# Patient Record
Sex: Male | Born: 1995 | Hispanic: No | Marital: Single
Health system: Southern US, Academic
[De-identification: ages and names within clinical notes are randomized; demographics above are authoritative.]

## PROBLEM LIST (undated history)

## (undated) ENCOUNTER — Ambulatory Visit: Attending: Pharmacist | Primary: Pharmacist

## (undated) ENCOUNTER — Encounter

## (undated) ENCOUNTER — Telehealth

## (undated) ENCOUNTER — Ambulatory Visit

## (undated) ENCOUNTER — Encounter
Attending: Pharmacist Clinician (PhC)/ Clinical Pharmacy Specialist | Primary: Pharmacist Clinician (PhC)/ Clinical Pharmacy Specialist

## (undated) ENCOUNTER — Encounter: Attending: Physician Assistant | Primary: Physician Assistant

## (undated) ENCOUNTER — Telehealth
Attending: Pharmacist Clinician (PhC)/ Clinical Pharmacy Specialist | Primary: Pharmacist Clinician (PhC)/ Clinical Pharmacy Specialist

## (undated) ENCOUNTER — Ambulatory Visit: Payer: PRIVATE HEALTH INSURANCE | Attending: Physician Assistant | Primary: Physician Assistant

## (undated) ENCOUNTER — Encounter: Attending: Neurology | Primary: Neurology

## (undated) ENCOUNTER — Ambulatory Visit: Payer: PRIVATE HEALTH INSURANCE

## (undated) ENCOUNTER — Inpatient Hospital Stay

## (undated) DIAGNOSIS — J302 Other seasonal allergic rhinitis: Secondary | ICD-10-CM

## (undated) DIAGNOSIS — J45909 Unspecified asthma, uncomplicated: Secondary | ICD-10-CM

---
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
## 2020-06-10 ENCOUNTER — Emergency Department
Admission: EM | Admit: 2020-06-10 | Discharge: 2020-06-10 | Disposition: A | Discharge status: Home / Self Care | Payer: Medicaid Other | Attending: Emergency Medicine | Admitting: Emergency Medicine

## 2020-06-10 ENCOUNTER — Encounter: Payer: Self-pay | Admitting: Emergency Medicine

## 2020-06-10 ENCOUNTER — Emergency Department: Payer: Medicaid Other

## 2020-06-10 ENCOUNTER — Other Ambulatory Visit: Payer: Self-pay

## 2020-06-10 DIAGNOSIS — Y929 Unspecified place or not applicable: Secondary | ICD-10-CM | POA: Insufficient documentation

## 2020-06-10 DIAGNOSIS — Y939 Activity, unspecified: Secondary | ICD-10-CM | POA: Diagnosis not present

## 2020-06-10 DIAGNOSIS — R55 Syncope and collapse: Secondary | ICD-10-CM

## 2020-06-10 DIAGNOSIS — S0990XA Unspecified injury of head, initial encounter: Secondary | ICD-10-CM | POA: Insufficient documentation

## 2020-06-10 DIAGNOSIS — W010XXA Fall on same level from slipping, tripping and stumbling without subsequent striking against object, initial encounter: Secondary | ICD-10-CM | POA: Diagnosis not present

## 2020-06-10 DIAGNOSIS — Y999 Unspecified external cause status: Secondary | ICD-10-CM | POA: Insufficient documentation

## 2020-06-10 LAB — CBC
HCT: 46.4 % (ref 39.0–52.0)
Hemoglobin: 15.9 g/dL (ref 13.0–17.0)
MCH: 30.7 pg (ref 26.0–34.0)
MCHC: 34.3 g/dL (ref 30.0–36.0)
MCV: 89.6 fL (ref 80.0–100.0)
Platelets: 134 10*3/uL — ABNORMAL LOW (ref 150–400)
RBC: 5.18 MIL/uL (ref 4.22–5.81)
RDW: 13.2 % (ref 11.5–15.5)
WBC: 8.2 10*3/uL (ref 4.0–10.5)
nRBC: 0 % (ref 0.0–0.2)

## 2020-06-10 LAB — URINALYSIS, COMPLETE (UACMP) WITH MICROSCOPIC
Bacteria, UA: NONE SEEN
Bilirubin Urine: NEGATIVE
Glucose, UA: NEGATIVE mg/dL
Hgb urine dipstick: NEGATIVE
Ketones, ur: NEGATIVE mg/dL
Leukocytes,Ua: NEGATIVE
Nitrite: NEGATIVE
Protein, ur: NEGATIVE mg/dL
Specific Gravity, Urine: 1.005 (ref 1.005–1.030)
Squamous Epithelial / LPF: NONE SEEN (ref 0–5)
pH: 7 (ref 5.0–8.0)

## 2020-06-10 LAB — BASIC METABOLIC PANEL
Anion gap: 6 (ref 5–15)
BUN: 11 mg/dL (ref 6–20)
CO2: 29 mmol/L (ref 22–32)
Calcium: 8.9 mg/dL (ref 8.9–10.3)
Chloride: 102 mmol/L (ref 98–111)
Creatinine, Ser: 1.16 mg/dL (ref 0.61–1.24)
GFR calc Af Amer: >60 mL/min (ref >60)
GFR calc non Af Amer: >60 mL/min (ref >60)
Glucose, Bld: 110 mg/dL — ABNORMAL HIGH (ref 70–99)
Potassium: 4.4 mmol/L (ref 3.5–5.1)
Sodium: 137 mmol/L (ref 135–145)

## 2020-06-10 LAB — TROPONIN I (HIGH SENSITIVITY)
Troponin I (High Sensitivity): <2 ng/L (ref <18)
Troponin I (High Sensitivity): 3 ng/L (ref <18)

## 2020-06-10 NOTE — ED Notes (Signed)
No peripheral IV placed this visit.   Discharge instructions reviewed with patient. Questions fielded by this RN. Patient verbalizes understanding of instructions. Patient discharged home in stable condition per jon, PA . No acute distress noted at time of discharge.
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
## 2020-06-10 NOTE — ED Notes (Signed)
Call bell answered; pt requests help with TV

## 2020-06-10 NOTE — ED Notes (Signed)
First Nurse Note: Pt to first nurse desk asking about wait time. Pt informed that we cannot give him a time of how much longer he will be waiting but we will get him back as soon as we are able to. Pt went back to sit outside.

## 2021-02-05 ENCOUNTER — Emergency Department
Admission: EM | Admit: 2021-02-05 | Discharge: 2021-02-05 | Disposition: A | Discharge status: Home / Self Care | Payer: Medicaid Other | Attending: Emergency Medicine | Admitting: Emergency Medicine

## 2021-02-05 ENCOUNTER — Other Ambulatory Visit: Payer: Self-pay

## 2021-02-05 ENCOUNTER — Encounter: Payer: Self-pay | Admitting: Emergency Medicine

## 2021-02-05 DIAGNOSIS — Z87891 Personal history of nicotine dependence: Secondary | ICD-10-CM | POA: Insufficient documentation

## 2021-02-05 DIAGNOSIS — J45909 Unspecified asthma, uncomplicated: Secondary | ICD-10-CM | POA: Diagnosis not present

## 2021-02-05 DIAGNOSIS — R202 Paresthesia of skin: Secondary | ICD-10-CM | POA: Insufficient documentation

## 2021-02-05 DIAGNOSIS — R200 Anesthesia of skin: Secondary | ICD-10-CM | POA: Diagnosis present

## 2021-02-05 HISTORY — DX: Other seasonal allergic rhinitis: J30.2

## 2021-02-05 HISTORY — DX: Unspecified asthma, uncomplicated: J45.909

## 2021-02-05 LAB — CBC
HCT: 38.4 % — ABNORMAL LOW (ref 39.0–52.0)
Hemoglobin: 12.9 g/dL — ABNORMAL LOW (ref 13.0–17.0)
MCH: 29.9 pg (ref 26.0–34.0)
MCHC: 33.6 g/dL (ref 30.0–36.0)
MCV: 89.1 fL (ref 80.0–100.0)
Platelets: 117 10*3/uL — ABNORMAL LOW (ref 150–400)
RBC: 4.31 MIL/uL (ref 4.22–5.81)
RDW: 13.1 % (ref 11.5–15.5)
WBC: 5.7 10*3/uL (ref 4.0–10.5)
nRBC: 0 % (ref 0.0–0.2)

## 2021-02-05 LAB — CK: Total CK: 171 U/L (ref 49–397)

## 2021-02-05 LAB — BASIC METABOLIC PANEL
Anion gap: 6 (ref 5–15)
BUN: 12 mg/dL (ref 6–20)
CO2: 26 mmol/L (ref 22–32)
Calcium: 9.5 mg/dL (ref 8.9–10.3)
Chloride: 106 mmol/L (ref 98–111)
Creatinine, Ser: 0.75 mg/dL (ref 0.61–1.24)
GFR, Estimated: >60 mL/min (ref >60)
Glucose, Bld: 81 mg/dL (ref 70–99)
Potassium: 3.5 mmol/L (ref 3.5–5.1)
Sodium: 138 mmol/L (ref 135–145)

## 2021-02-05 LAB — PHOSPHORUS: Phosphorus: 3.3 mg/dL (ref 2.5–4.6)

## 2021-02-05 LAB — MAGNESIUM: Magnesium: 2.1 mg/dL (ref 1.7–2.4)

## 2021-02-05 LAB — TSH: TSH: 0.853 u[IU]/mL (ref 0.350–4.500)

## 2021-02-05 MED ORDER — DIPHENHYDRAMINE HCL 25 MG PO CAPS
25.0000 mg | ORAL_CAPSULE | Freq: Once | ORAL | Status: AC
  Administered 2021-02-05: 25 mg via ORAL
  Filled 2021-02-05: qty 1

## 2021-02-05 MED ORDER — PREDNISONE 20 MG PO TABS
40.0000 mg | ORAL_TABLET | Freq: Once | ORAL | Status: AC
Start: 2021-02-05 — End: 2021-02-05
  Administered 2021-02-05: 40 mg via ORAL
  Filled 2021-02-05: qty 2

## 2021-02-05 NOTE — Discharge Instructions (Addendum)
Your work-up today is normal, including basic labs, electrolytes, and thyroid function.  There are no signs of a stroke or other nervous system cause.  Return to the ER for new, worsening, or persistent severe numbness, tingling, weakness, or any other new or worsening symptoms that concern you.  Follow-up with your primary care doctor within the next 1 to 2 weeks if your symptoms do not improve.

## 2021-02-05 NOTE — ED Provider Notes (Signed)
[facility] Emergency Department Provider Note ____________________________________________   Event Date/Time   First MD Initiated Contact with Patient 02/05/21 1942     (approximate)  I have reviewed the triage vital signs and the nursing notes.   HISTORY  Chief Complaint Numbness    HPI Omar Cooke is a 25 y.o. male with PMH as noted below who presents with paresthesias to bilateral upper and lower extremities as well as to his torso.  He describes as a tingling sensation and sometimes burning.  It is symmetrical bilaterally.  He states that it started about a week ago and has been persistent since then.  He almost feels like his hands are swollen, but looking at them he knows they are not swollen.  He denies any actual numbness or weakness.  He denies any prior history of similar symptoms.  He has no associated shortness of breath, rash, hives, palpitations, or other acute symptoms.  He denies any known allergic exposures.  Past Medical History:  Diagnosis Date  . Asthma   . Seasonal allergies     There are no problems to display for this patient.   History reviewed. No pertinent surgical history.  Prior to Admission medications   Medication Sig Start Date End Date Taking? Authorizing Provider  ibuprofen (ADVIL,MOTRIN) 600 MG tablet Take 1 tablet (600 mg total) by mouth every 6 (six) hours as needed for pain. 07/28/13   Zadie Rhine, MD  oxyCODONE-acetaminophen (PERCOCET/ROXICET) 5-325 MG per tablet Take 1 tablet by mouth every 4 (four) hours as needed for pain. 07/28/13   Zadie Rhine, MD    Allergies Bee venom  History reviewed. No pertinent family history.  Social History Social History   Tobacco Use  . Smoking status: Former Games developer  . Smokeless tobacco: Never Used  Vaping Use  . Vaping Use: Every day  Substance Use Topics  . Alcohol use: Yes    Comment: occasionally  . Drug use: Yes    Frequency: 7.0 times per week     Types: Marijuana    Review of Systems  Constitutional: No fever. Eyes: No visual changes. ENT: No sore throat. Cardiovascular: Denies chest pain. Respiratory: Denies shortness of breath. Gastrointestinal: No vomiting. Genitourinary: Negative for dysuria.  Musculoskeletal: Negative for back pain. Skin: Negative for rash. Neurological: Negative for headaches, focal weakness or numbness.   ____________________________________________   PHYSICAL EXAM:  VITAL SIGNS: ED Triage Vitals  Enc Vitals Group     BP 02/05/21 1711 129/74     Pulse Rate 02/05/21 1711 68     Resp 02/05/21 1711 20     Temp 02/05/21 1711 98.2 F (36.8 C)     Temp Source 02/05/21 1711 Oral     SpO2 02/05/21 1711 100 %     Weight 02/05/21 1712 160 lb (72.6 kg)     Height 02/05/21 1712 6\' 1"  (1.854 m)     Head Circumference --      Peak Flow --      Pain Score 02/05/21 1712 0     Pain Loc --      Pain Edu? --      Excl. in GC? --     Constitutional: Alert and oriented. Well appearing and in no acute distress. Eyes: Conjunctivae are normal.  Head: Atraumatic. Nose: No congestion/rhinnorhea. Mouth/Throat: Mucous membranes are moist.   Neck: Normal range of motion.  Cardiovascular: Normal rate, regular rhythm.  Good peripheral circulation. Respiratory: Normal respiratory effort.  No retractions.  Gastrointestinal: No distention.  Musculoskeletal: No lower extremity edema.  Extremities warm and well perfused.  Neurologic:  Normal speech and language.  5/5 motor strength and intact sensation all extremities.  Normal coordination.  No ataxia.  No pronator drift.  Skin:  Skin is warm and dry. No rash or urticaria noted. Psychiatric: Mood and affect are normal. Speech and behavior are normal.  ____________________________________________   LABS (all labs ordered are listed, but only abnormal results are displayed)  Labs Reviewed  CBC - Abnormal; Notable for the following components:      Result Value    Hemoglobin 12.9 (*)    HCT 38.4 (*)    Platelets 117 (*)    All other components within normal limits  BASIC METABOLIC PANEL  CK  MAGNESIUM  PHOSPHORUS  TSH   ____________________________________________  EKG  ED ECG REPORT I, Dionne Bucy, the attending physician, personally viewed and interpreted this ECG.  Date: 02/05/2021 EKG Time: 1715 Rate: 74 Rhythm: normal sinus rhythm QRS Axis: normal Intervals: normal ST/T Wave abnormalities: normal Narrative Interpretation: no evidence of acute ischemia  ____________________________________________  RADIOLOGY    ____________________________________________   PROCEDURES  Procedure(s) performed: No  Procedures  Critical Care performed: No ____________________________________________   INITIAL IMPRESSION / ASSESSMENT AND PLAN / ED COURSE  Pertinent labs & imaging results that were available during my care of the patient were reviewed by me and considered in my medical decision making (see chart for details).  25 year old male with PMH as noted above presents with bilateral upper and lower extremity and torso tingling over the last week.  He has no significant associated symptoms and denies any anxiety.  He has no known allergic exposures.  On exam he is well-appearing.  His vital signs are normal.  The physical exam is unremarkable.  Neurologic exam is normal.  Initial basic labs were obtained and are within normal limits.  There is no evidence of electrolyte abnormality.  Differential includes dehydration, anxiety, allergic reaction, peripheral neuropathy, or other benign etiology.  We will obtain additional labs and give Benadryl and prednisone to see if this improves the symptoms.  ----------------------------------------- 10:00 PM on 02/05/2021 -----------------------------------------  Additional lab work-up including magnesium, phosphorus, CK, and TSH are all within normal limits.  Patient reports no  change in his symptoms.  He continues to appear comfortable.  He is stable for discharge home.  I counseled him on the results of the work-up.  I recommended that he follow-up with his PMD within the next 1 to 2 weeks.  Return precautions given, and he expresses understanding.  ____________________________________________   FINAL CLINICAL IMPRESSION(S) / ED DIAGNOSES  Final diagnoses:  Paresthesia      NEW MEDICATIONS STARTED DURING THIS VISIT:  Discharge Medication List as of 02/05/2021  9:50 PM       Note:  This document was prepared using Dragon voice recognition software and may include unintentional dictation errors.   Dionne Bucy, MD 02/05/21 2243

## 2021-02-05 NOTE — ED Triage Notes (Signed)
Pt via POV from home. Pt c/o numbness and tingling in bilateral legs, fingertips, and in his chest that started over week ago. Pt denies pain. Denies any medical hx. Denies SOB. Pt is A&Ox4 and NAD.

## 2021-04-21 ENCOUNTER — Emergency Department
Admit: 2021-04-21 | Discharge: 2021-04-21 | Disposition: A | Discharge status: Home / Self Care | Payer: PRIVATE HEALTH INSURANCE

## 2021-04-21 ENCOUNTER — Encounter
Admit: 2021-04-21 | Discharge: 2021-04-21 | Disposition: A | Discharge status: Home / Self Care | Payer: PRIVATE HEALTH INSURANCE

## 2021-04-21 MED ORDER — OXYCODONE 5 MG TABLET
ORAL_TABLET | ORAL | 0 refills | 1 days | Status: CP | PRN
Start: 2021-04-21 — End: ?

## 2021-05-15 ENCOUNTER — Ambulatory Visit: Payer: Medicaid Other | Admitting: Physician Assistant

## 2021-05-15 ENCOUNTER — Other Ambulatory Visit: Payer: Self-pay

## 2021-05-15 ENCOUNTER — Encounter: Payer: Self-pay | Admitting: Physician Assistant

## 2021-05-15 DIAGNOSIS — F419 Anxiety disorder, unspecified: Secondary | ICD-10-CM

## 2021-05-15 DIAGNOSIS — Z113 Encounter for screening for infections with a predominantly sexual mode of transmission: Secondary | ICD-10-CM | POA: Diagnosis not present

## 2021-05-15 NOTE — Progress Notes (Signed)
[facility] Department STI clinic/screening visit  Subjective:  Omar Cooke is a 25 y.o. male being seen today for an STI screening visit. The patient reports they do not have symptoms.    Patient has the following medical conditions:  There are no problems to display for this patient.    Chief Complaint  Patient presents with   SEXUALLY TRANSMITTED DISEASE    screening    HPI  Patient reports that he is not having symptoms but would like a screening today.  Reports that he has recently been seen in the ER for numbness/tingling and pains and wants to make sure that he is tested for "everything" to rule out all causes of his symptoms.  Reports that he has an appointment with his doctor in about 1.5 months for further evaluation.  Reports last HIV test was about 1 month ago and last void prior to sample collection for GC/Chlamydia test was about 2 hr ago.    See flowsheet for further details and programmatic requirements.    The following portions of the patient's history were reviewed and updated as appropriate: allergies, current medications, past medical history, past social history, past surgical history and problem list.  Objective:  There were no vitals filed for this visit.  Physical Exam Constitutional:      General: He is not in acute distress.    Appearance: Normal appearance.  HENT:     Head: Normocephalic and atraumatic.     Comments: No nits,lice, or hair loss. No cervical, supraclavicular or axillary adenopathy.     Mouth/Throat:     Mouth: Mucous membranes are moist.     Pharynx: Oropharynx is clear. No oropharyngeal exudate or posterior oropharyngeal erythema.  Eyes:     Conjunctiva/sclera: Conjunctivae normal.  Pulmonary:     Effort: Pulmonary effort is normal.  Abdominal:     Palpations: Abdomen is soft. There is no mass.     Tenderness: There is no abdominal tenderness. There is no guarding or rebound.  Genitourinary:    Penis:  Normal.      Testes: Normal.     Rectum: Normal.     Comments: Pubic area without nits, lice, hair loss, edema, erythema, lesions and inguinal adenopathy. Penis circumcised without rash, lesions and discharge at meatus. Testicles descended bilaterally,nt, no masses or edema.  Musculoskeletal:     Cervical back: Neck supple. No tenderness.  Skin:    General: Skin is warm and dry.     Findings: No bruising, erythema, lesion or rash.  Neurological:     Mental Status: He is alert and oriented to person, place, and time.  Psychiatric:        Mood and Affect: Mood normal.        Behavior: Behavior normal.        Thought Content: Thought content normal.        Judgment: Judgment normal.      Assessment and Plan:  Omar Cooke is a 25 y.o. male presenting to the [facility] Department for STI screening  1. Screening for STD (sexually transmitted disease) Patient into clinic without symptoms. Rec condoms with all sex. Await test results.  Counseled that RN will call if needs to RTC for treatment once results are back.  - Chlamydia/Gonorrhea [place] [facility] - HIV [place] [facility] - Syphilis Serology, [place] [facility] - Chlamydia/Gonorrhea [place] [facility] - Chlamydia/Gonorrhea  Lab  2. Anxiety Patient reports history of anxiety and  is not sure about restarting counseling. Patient declines referral to LCSW, but does accept LCSW and [facility] cards.    No follow-ups on file.  No future appointments.  Matt Holmes, PA

## 2021-05-23 LAB — HM HIV SCREENING LAB: HM HIV Screening: NEGATIVE

## 2021-07-23 ENCOUNTER — Ambulatory Visit: Admit: 2021-07-23 | Discharge: 2021-07-24 | Payer: PRIVATE HEALTH INSURANCE

## 2021-07-23 DIAGNOSIS — M62838 Other muscle spasm: Principal | ICD-10-CM

## 2021-07-23 DIAGNOSIS — R202 Paresthesia of skin: Principal | ICD-10-CM

## 2021-07-23 DIAGNOSIS — F419 Anxiety disorder, unspecified: Principal | ICD-10-CM

## 2021-07-23 DIAGNOSIS — Z91030 Bee allergy status: Principal | ICD-10-CM

## 2021-07-23 MED ORDER — SERTRALINE 50 MG TABLET
ORAL_TABLET | Freq: Every day | ORAL | 2 refills | 30.00000 days | Status: CP
Start: 2021-07-23 — End: 2022-07-23

## 2021-07-23 MED ORDER — CYCLOBENZAPRINE 10 MG TABLET
ORAL_TABLET | Freq: Every evening | ORAL | 0 refills | 30.00000 days | Status: CP | PRN
Start: 2021-07-23 — End: ?

## 2021-07-23 MED ORDER — EPINEPHRINE 0.3 MG/0.3 ML INJECTION, AUTO-INJECTOR
Freq: Once | INTRAMUSCULAR | 1 refills | 2 days | Status: CP
Start: 2021-07-23 — End: 2021-07-23

## 2021-12-06 ENCOUNTER — Ambulatory Visit: Admit: 2021-12-06 | Discharge: 2021-12-06 | Payer: PRIVATE HEALTH INSURANCE

## 2021-12-06 ENCOUNTER — Other Ambulatory Visit: Admit: 2021-12-06 | Discharge: 2021-12-06 | Payer: PRIVATE HEALTH INSURANCE

## 2021-12-06 DIAGNOSIS — R202 Paresthesia of skin: Principal | ICD-10-CM

## 2021-12-06 DIAGNOSIS — R9082 White matter disease, unspecified: Principal | ICD-10-CM

## 2021-12-06 DIAGNOSIS — M62838 Other muscle spasm: Principal | ICD-10-CM

## 2021-12-06 MED ORDER — CYCLOBENZAPRINE 5 MG TABLET
ORAL_TABLET | Freq: Two times a day (BID) | ORAL | 1 refills | 15.00000 days | Status: CP | PRN
Start: 2021-12-06 — End: 2022-01-05

## 2021-12-12 IMAGING — CT CT HEAD W/O CM
3 series · 15 of 47 positions shown, 18 images · non-contrast
Comparison: None.

CLINICAL DATA: Fall, positive head strike

EXAM:
CT HEAD WITHOUT CONTRAST
TECHNIQUE: Contiguous axial images were obtained from the base of the skull
through the vertex without intravenous contrast.

[Series 3: head wo · axial · 0.42mm/px · z∈[+206,+331]mm · 9 of 31 slices shown, 12 images]
[im 3/31  brain]
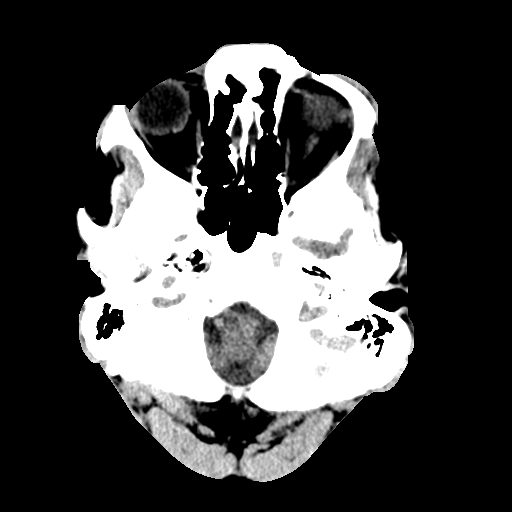
[im 3/31  bone]
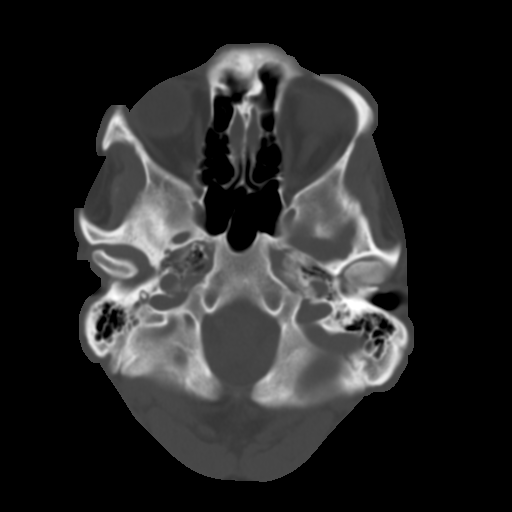
[im 6/31  brain]
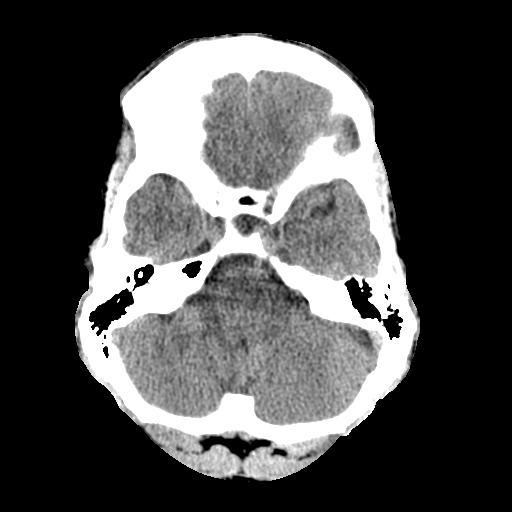
[im 9/31  brain]
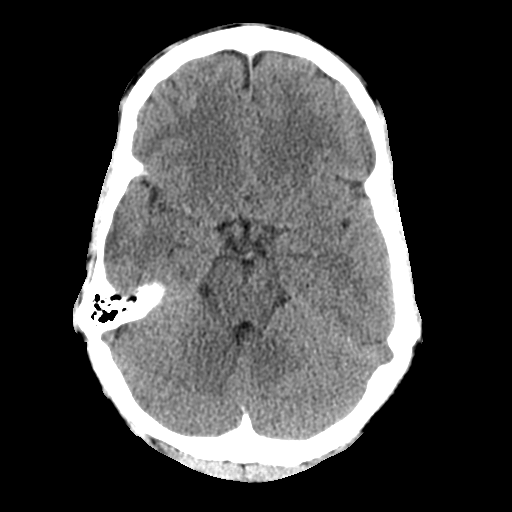
[im 12/31  brain]
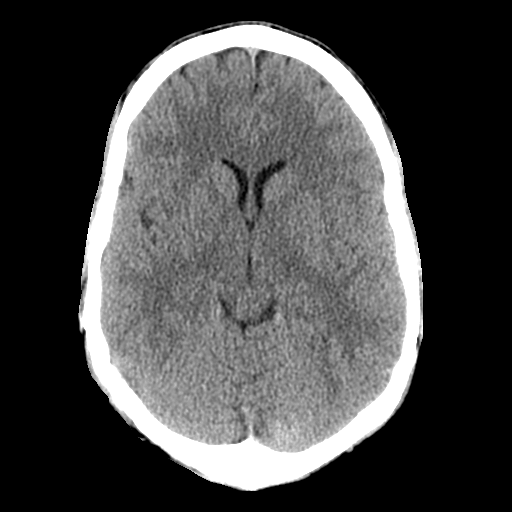
[im 16/31  brain]
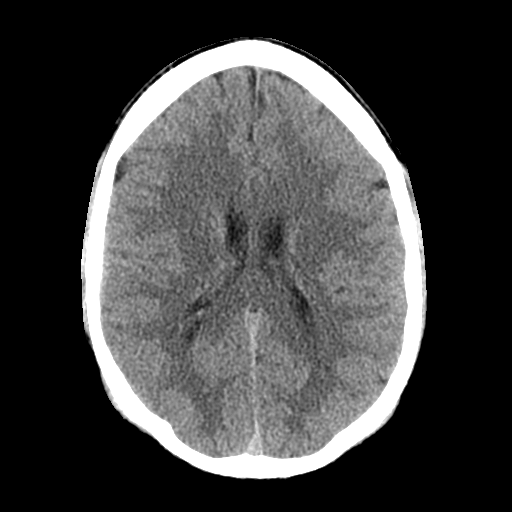
[im 16/31  bone]
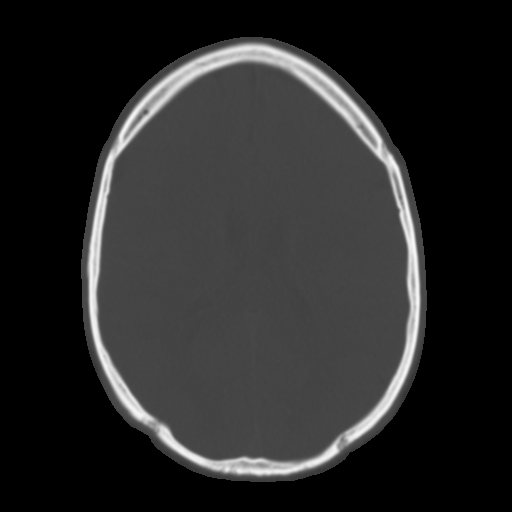
[im 19/31  brain]
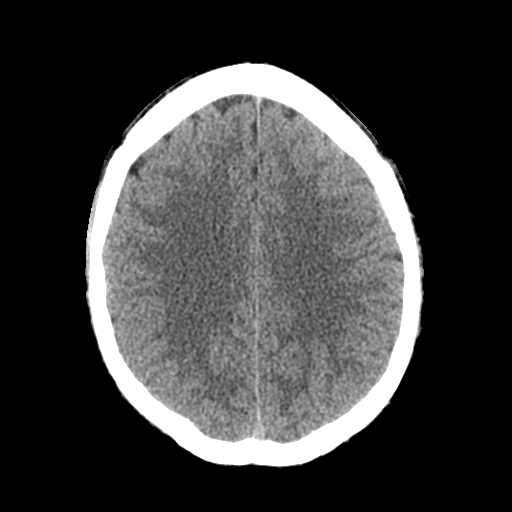
[im 22/31  brain]
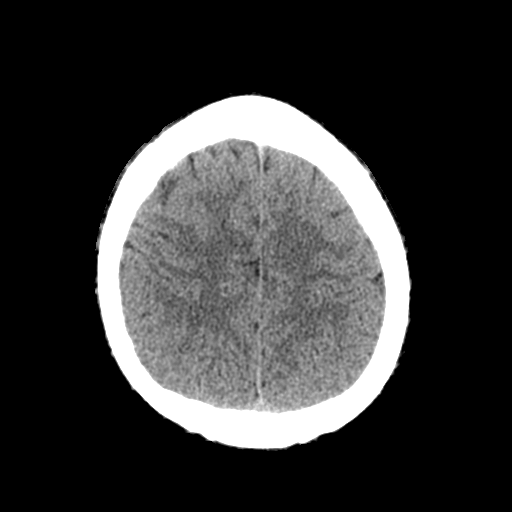
[im 25/31  brain]
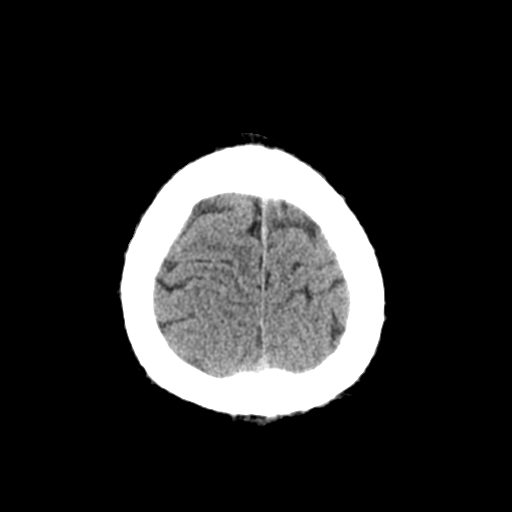
[im 28/31  brain]
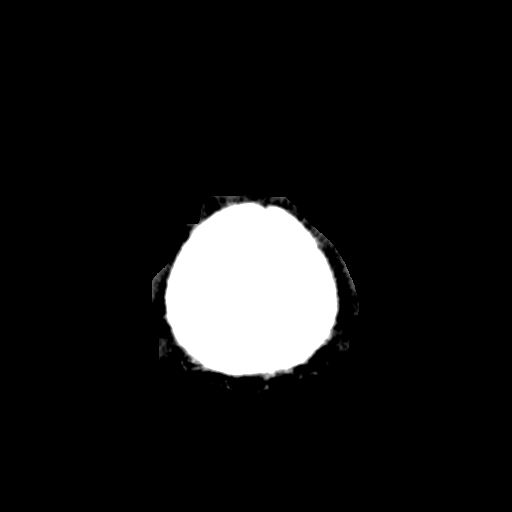
[im 28/31  bone]
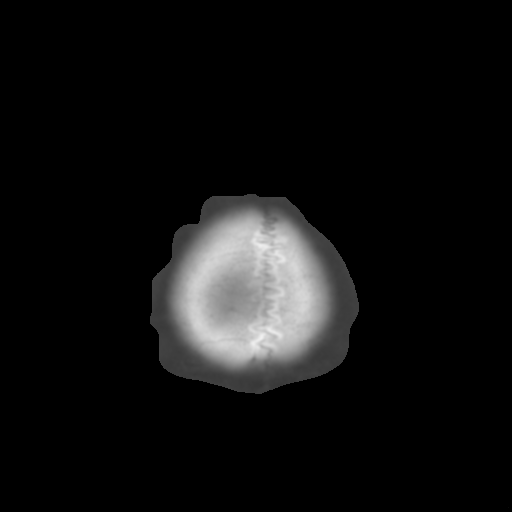

[Series 4: coronal soft tissue · coronal · 0.29mm/px · 3 of 67 slices shown]
[im 23/67  brain]
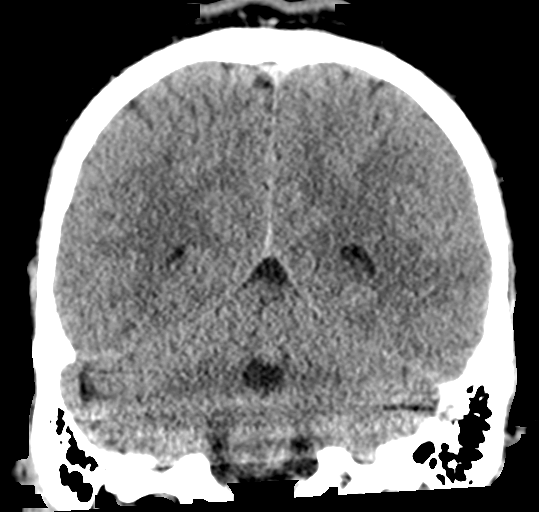
[im 30/67  brain]
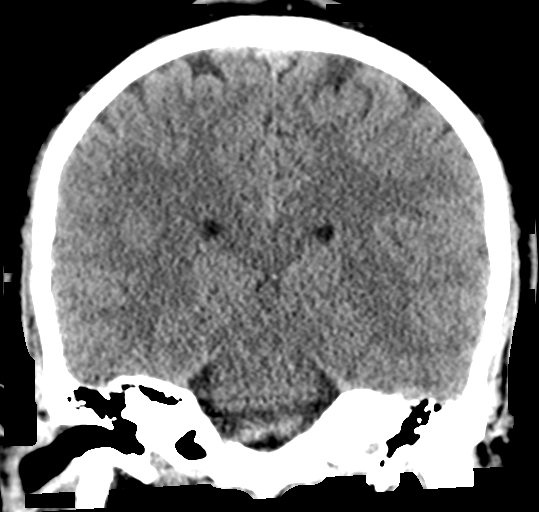
[im 37/67  brain]
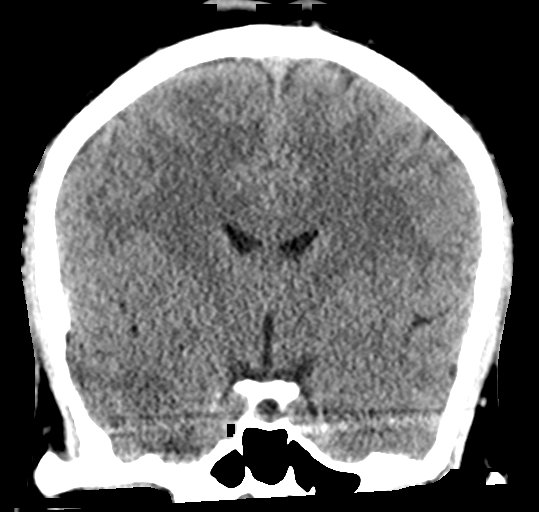

[Series 5: sagittal soft tissue · sagittal · 0.30mm/px · 3 of 54 slices shown]
[im 18/54  brain]
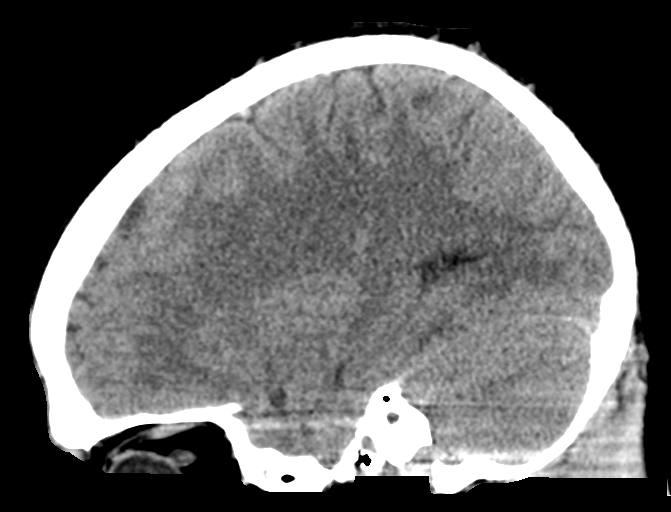
[im 27/54  brain]
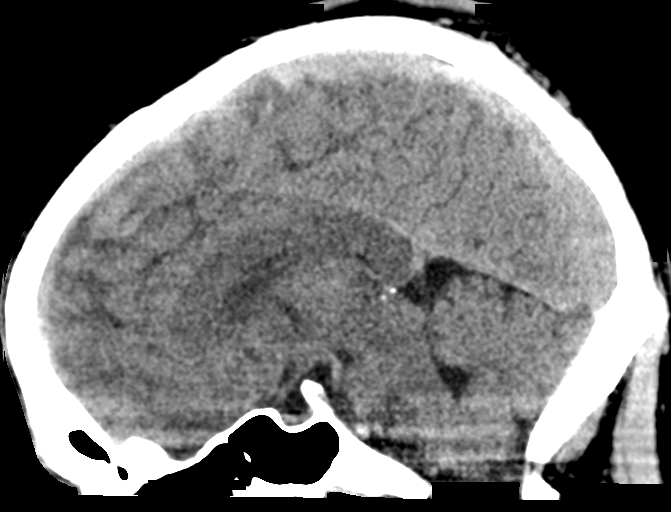
[im 36/54  brain]
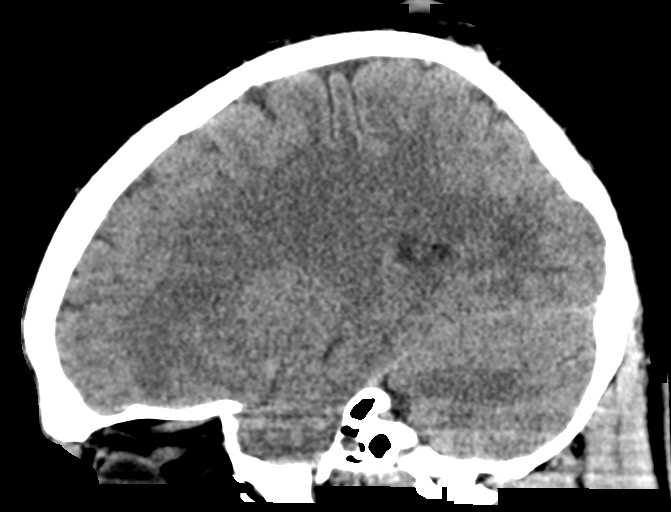

[15 of 47 positions shown; findings below may reference images not displayed]

FINDINGS: Brain: No evidence of acute infarction, hemorrhage, hydrocephalus,
extra-axial collection or mass lesion/mass effect.

Vascular: No hyperdense vessel or unexpected calcification.

Skull: No significant scalp swelling or hematoma. No calvarial
fracture. Small intramuscular lipoma of the superficial layer of the
right occipital/suboccipital musculature ([DATE]) measuring 0.4 x
cm in size.

Sinuses/Orbits: Paranasal sinuses and mastoid air cells are
predominantly clear. Included orbital structures are unremarkable.

Other: None.
IMPRESSION: 1. No acute intracranial abnormality. No significant scalp swelling
or hematoma. No calvarial fracture.
2. Small intramuscular lipoma of the superficial layer of the right
occipital/suboccipital musculature.

## 2021-12-12 IMAGING — CR DG CHEST 2V
1 series · 2 of 2 positions shown · non-contrast
Comparison: None.

CLINICAL DATA: Chest pain and shortness of breath.

EXAM:
CHEST - 2 VIEW

[Series 1: dg chest 2 view · 0.14mm/px · 2 of 2 slices shown]
[im 1/2]
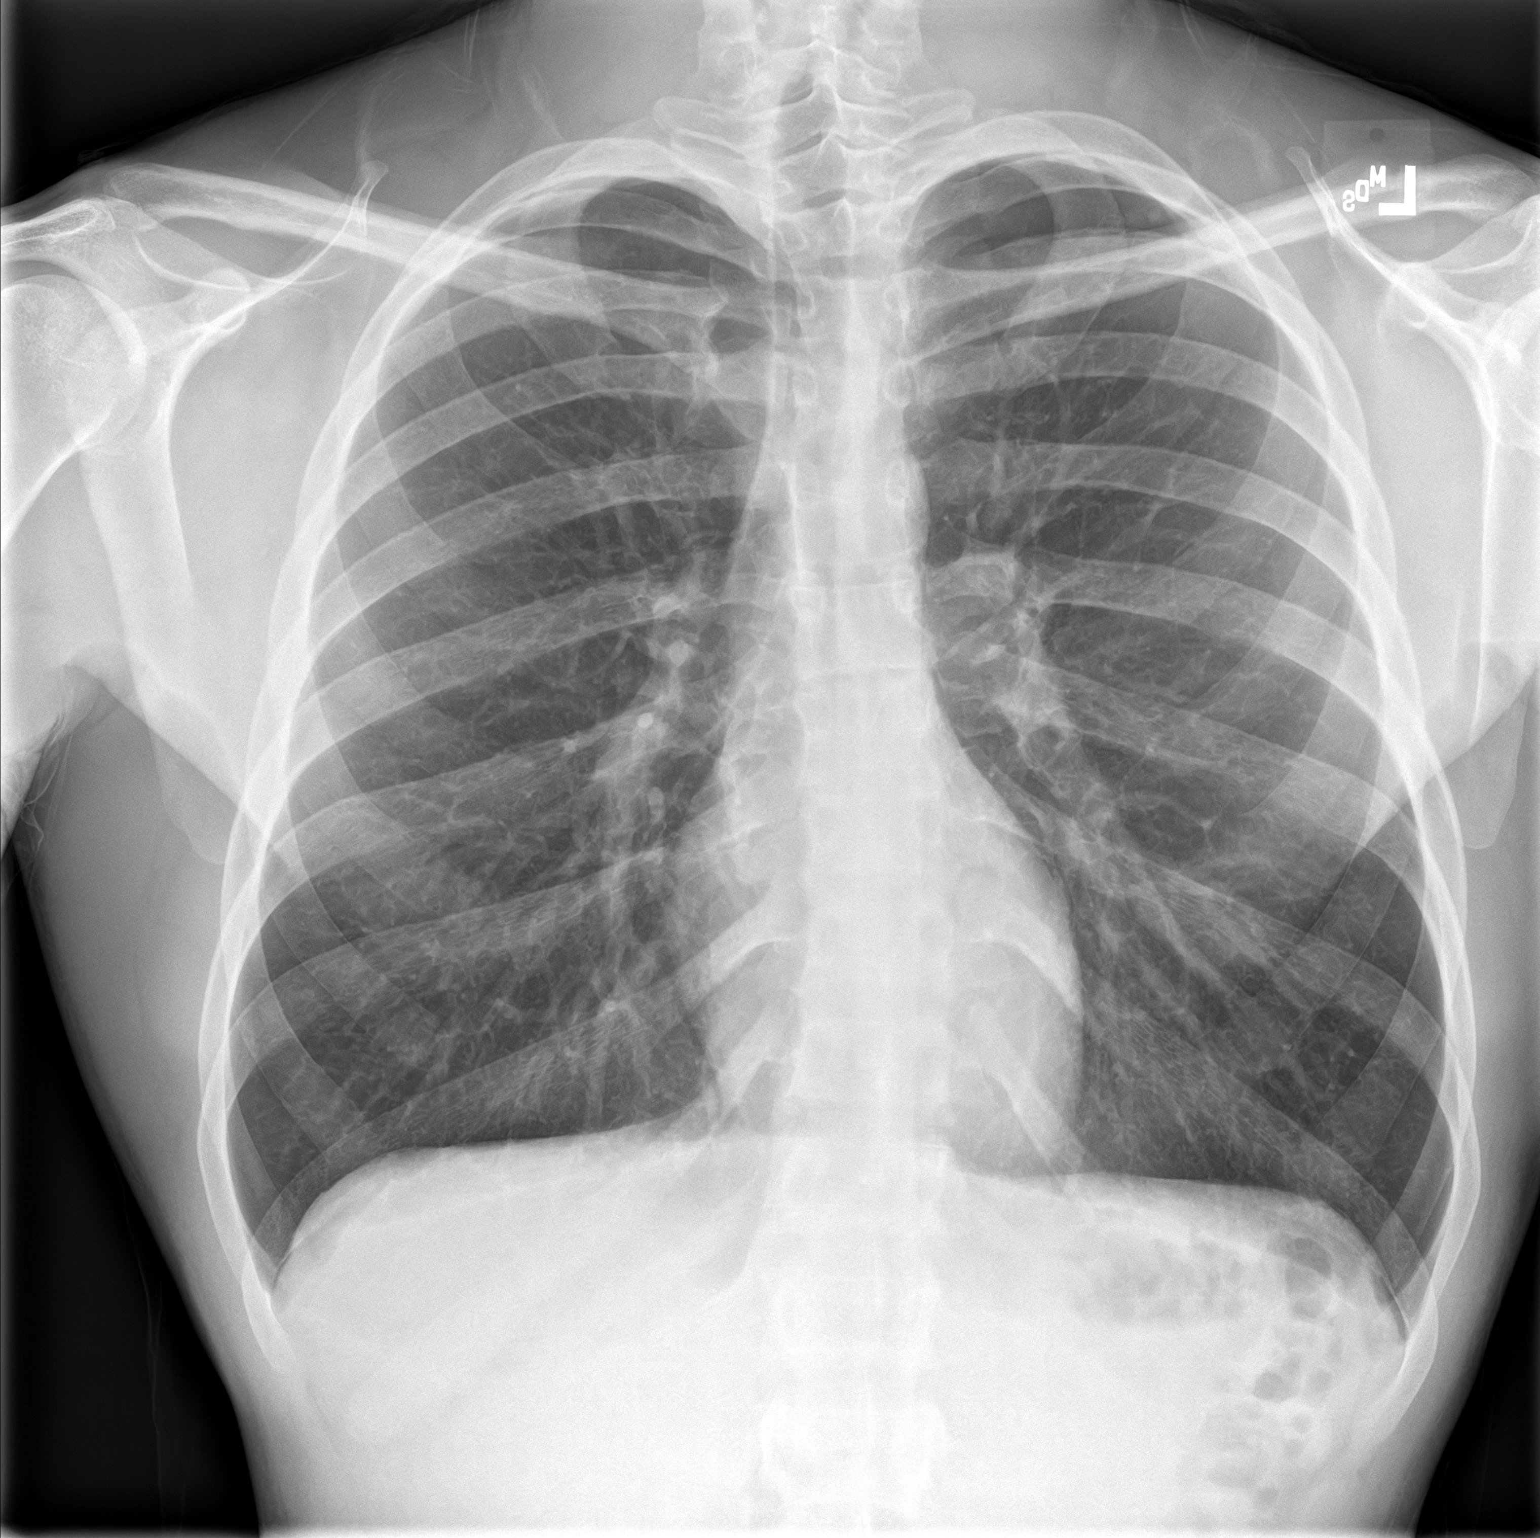
[im 2/2]
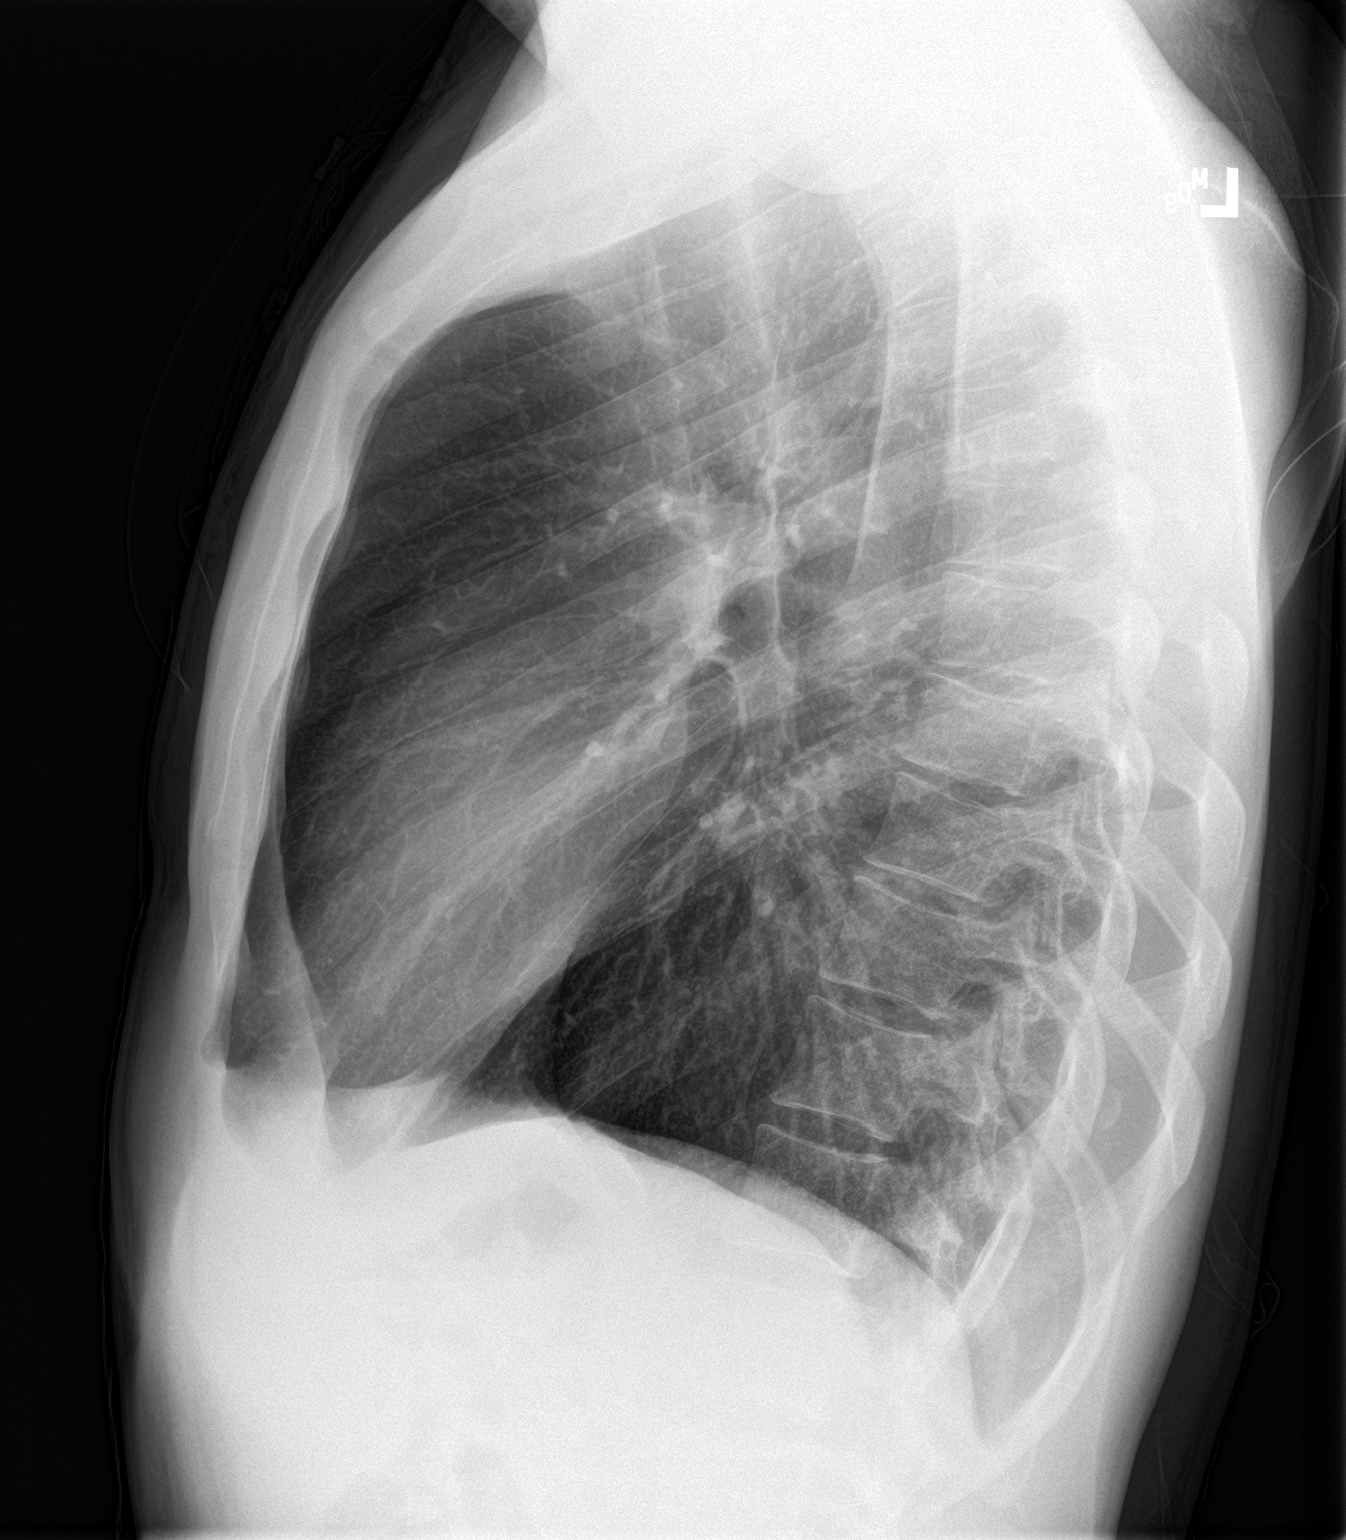

[2 of 2 positions shown; findings below may reference images not displayed]

FINDINGS: The heart size and mediastinal contours are within normal limits.
Both lungs are clear. The visualized skeletal structures are
unremarkable.
IMPRESSION: Normal examination.

## 2021-12-13 ENCOUNTER — Ambulatory Visit: Admit: 2021-12-13 | Discharge: 2021-12-14 | Payer: PRIVATE HEALTH INSURANCE | Attending: Neurology | Primary: Neurology

## 2021-12-13 MED ORDER — ERGOCALCIFEROL (VITAMIN D2) 1,250 MCG (50,000 UNIT) CAPSULE
ORAL_CAPSULE | ORAL | 11 refills | 28 days | Status: CP
Start: 2021-12-13 — End: 2022-12-13

## 2022-03-06 DIAGNOSIS — M62838 Other muscle spasm: Principal | ICD-10-CM

## 2022-03-07 DIAGNOSIS — Z91030 Bee allergy status: Principal | ICD-10-CM

## 2022-03-07 MED ORDER — EPIPEN 0.3 MG/0.3 ML INJECTION, AUTO-INJECTOR
Freq: Once | INTRAMUSCULAR | 0 refills | 2 days | Status: CP
Start: 2022-03-07 — End: 2022-03-07

## 2022-03-18 DIAGNOSIS — M62838 Other muscle spasm: Principal | ICD-10-CM

## 2022-03-18 MED ORDER — CYCLOBENZAPRINE 5 MG TABLET
ORAL_TABLET | Freq: Two times a day (BID) | ORAL | 1 refills | 30 days | Status: CP | PRN
Start: 2022-03-18 — End: ?

## 2022-03-24 DIAGNOSIS — G35 Multiple sclerosis: Principal | ICD-10-CM

## 2022-06-11 MED ORDER — CYCLOBENZAPRINE 5 MG TABLET
ORAL_TABLET | Freq: Two times a day (BID) | ORAL | 0 refills | 30 days | Status: CP | PRN
Start: 2022-06-11 — End: ?

## 2022-06-12 MED ORDER — SERTRALINE 50 MG TABLET
ORAL_TABLET | Freq: Every day | ORAL | 0 refills | 30 days | Status: CP
Start: 2022-06-12 — End: ?

## 2022-07-01 MED ORDER — SERTRALINE 50 MG TABLET
ORAL_TABLET | Freq: Every day | ORAL | 0 refills | 30 days | Status: CP
Start: 2022-07-01 — End: ?

## 2022-07-03 ENCOUNTER — Ambulatory Visit
Admit: 2022-07-03 | Discharge: 2022-07-04 | Payer: PRIVATE HEALTH INSURANCE | Attending: Physician Assistant | Primary: Physician Assistant

## 2022-07-03 DIAGNOSIS — G35 Multiple sclerosis: Principal | ICD-10-CM

## 2022-07-03 MED ORDER — BACLOFEN 10 MG TABLET
ORAL_TABLET | Freq: Three times a day (TID) | ORAL | 5 refills | 30 days | Status: CP
Start: 2022-07-03 — End: 2023-07-03

## 2022-07-03 NOTE — Unmapped (Signed)
The [place] [facility] of Medicine at [facility]  Multiple Sclerosis / Neuroimmunology Division  [facility]on [facility]  Physician Assistant, [place], [place]  Phone: [phone]  Fax: [phone]      Richard Mccoy  1996/08/08  [id number]  [address]  [place] [address]     Direct entry by:  Annette Stable, PA-C, 26 MPAS.  Teaching Physician: Dr. Desma Mcgregor, Dr. Quillian Quince.    DATE OF VISIT: July 03, 2022.    REASON FOR VISIT: Followup in the Neuroimmunology Clinic for evaluation for  Multiple Sclerosis.       I personally spent 54 minutes face-to-face and non-face-to-face in the care of this patient, which includes all pre, intra, and post visit time on the date of service.  All documented time was specific to the E/M visit and does not include any procedures that may have been performed.    Last evaluated by Dr. Renda Rolls / Trisha Mangle  12/13/2021.  Refer to this note for clinical details of prior history which I personally reviewed.    ASSESSMENT AND PLAN:  A 26 y.o. mixed race right handed male who has Bee sting allergy; Muscle spasm; Anxiety; and Paresthesia on their problem list.and Asthma.    ** Relapsing Remitting Multiple Sclerosis:  -MS symptom onset : April, 2022  -MS date of diagnosis: Jan,2023  -MRI data: C2-C3 non enhancing lesion and multiple demyelinating lesions in the periventricular areas. NO thoracic spine MRI has been performed.  -Four or more oligoclonal bands in the CSF. Elevated igG index at 1.2.  -DIS and DIT criteria has been fulfilled.  -MS mimickers including NMO, MOG,were negative. IgG, IgA, IgM, Hep bilateral and C serologies = wnl.  -He will benefit from to high efficacy DMD given young age, male sex and spinal cord lesions are poor prognostic features.  -Discussion on what MS is, DMT's and MOA. Discussed Kesimpta, Ocrevus, Tysabri and S1P modulators.  -Patient would like to start Kesimpta. SRF signed, dosing and side effects explained and handout given.  -Repeat CBC/diff today.  -Reminder to stay up to date on all vaccines including Flu and COVID Bivalent booster and if not previously obtained the Shingles and Pneumonia vaccine. Stay current with all cancer screenings including dermatology, y per CDC guidelines for people under immunosuppression. This can be obtained or arrange through the Primary provider.     **Vitamin D Deficiency:  -Check Vitamin D 25-OH today.  -Continue D3 50, 000 units for now.    **MS Hugs:  -Handout provided.  -Discontinue Flexeril and start Baclofen 10 mg TID prn.    **Return to clinic 3 months after starting new DMT with CCP and in 6 months with me, extended visit after re-baseline MRI's.     DIAGNOSTIC STUDIES / REVIEW OF RECORDS:  This is my first visit with this patient.  I personally reviewed the patient's prior [facility] medical records, imaging, and lab work.    INTERVAL HISTORY / CHIEF COMPLAINT :  Patient reports n0 MS relapses / sustained progression since the last visit .    Current symptoms: (If blank =  none).  Vision/double vision:  Speech, swallowing problems:  Weakness:  Fatigue:  Tingling/numbness/pain:  Balance/coordination problems:  Bowel/bladder control problems:  Memory, mood:  Gait:  Falls:  Headaches:   Seizures:  Other symptoms: Muscle spasms.     VITAL SIGNS  BP 143/72 (BP Site: L Arm, BP Position: Sitting, BP Cuff Size: Large)  - Pulse 79  -  Ht 185.4 cm (6' 1)  - Wt 87.6 kg (193 lb 3.2 oz)  - BMI 25.49 kg/m??     PHYSICAL EXAMINATION:  GENERAL:  Alert and oriented to person, place, time and situation.    Recent and remote memory intact.      Neurological Examination:   Cranial Nerves:   II, III- Pupils are equal 3 mm and reactive to light b/l.  Visual Acuity:  OD 20/20, OS 20/20, -1.  III, IV, VI- extra ocular movements are intact, No ptosis, no nystagmus.  V- sensation of the face intact b/l.  VII- face symmetrical, no facial droop, normal facial movements with smile/grimace  VIII- Hearing grossly intact.  IX and X- symmetric palate contraction, normal gag bilaterally  XI- Full shoulder shrug bilaterally  XII- Tongue protrudes midline, full range of movements of the tongue   Motor Exam:      Muscles UEs   LEs     R L   R L   Deltoids 5/5 5/5 Hip flexors  5/5 5/5   Biceps 5/5 5/5 Hip extensors 5/5 5/5   Triceps 5/5 5/5 Knee flexors 5/5 5/5   Hand grip 5/5 5/5 Knee extensors 5/5 5/5      Foot dorsal flexors 5/5 5/5      Foot plantar flexors 5/5 5/5      Normal bulk and tone.  No clonus.    Reflexes R L   Brachioradialis +2 +2   Biceps +2 +2   Triceps +2 +2   Patella +2 +2   Achilles +2 +2     Negative babinski.    Sensory UEs LEs    R L R L   Light touch WNL WNL WNL WNL   Pin prick WNL WNL WNL WNL   Vibration, >10 seconds  WNL WNL WNL WNL   Proprioception   WNL WNL      Cerebellar/Coordination:  Rapid alternating movements, finger-to-nose and heel-to-shin  bilaterally demonstrates no abnormalities.    Romberg was negative with eyes closed.    Gait: Normal stride, base and  armswing. Able to tandem, heel, and toe gait without difficulty.     REVIEW OF SYSTEMS:  A 10-systems review was performed and, unless otherwise noted, declared negative by patient.    No visits with results within 6 Month(s) from this visit.   Latest known visit with results is:   Office Visit on 12/13/2021   Component Date Value Ref Range Status    IgA 12/13/2021 120.5  70.0 - 400.0 mg/dL Final    IgM 16/08/9603 90  40 - 230 mg/dL Final    Total IgG 54/07/8118 1,136  646 - 2,013 mg/dL Final    Dilute Viper Venom Time 12/13/2021 38.9  0.0 - 46.2 s Final    APTT Lupus Anticoagulant 12/13/2021 45.6  0.0 - 49.3 s Final    Anti-PL Interp 12/13/2021    Final    Beta-2 Glyco 1 IgG 12/13/2021 3.0  <20.0 Final    Beta-2 Glyco 1 IgM 12/13/2021 1.0  <20.0 Final    Anticardiolipin IgG 12/13/2021 6.0  0.0 - 23.0 [GPL'U] Final    Anticardiolipin IgM 12/13/2021 2.0  0.0 - 11.0 [MPL'U] Final       PROBLEM LIST:    Patient Active Problem List   Diagnosis Bee sting allergy    Muscle spasm    Anxiety    Paresthesia         CURRENT MEDICATIONS:    Current Outpatient Medications  Medication Sig Dispense Refill    cyclobenzaprine (FLEXERIL) 5 MG tablet Take 1 tablet (5 mg total) by mouth two (2) times a day as needed. 60 tablet 0    ergocalciferol-1,250 mcg, 50,000 unit, (DRISDOL) 1,250 mcg (50,000 unit) capsule Take 1 capsule (1,250 mcg total) by mouth once a week. 4 capsule 11    EPINEPHrine (EPIPEN) 0.3 mg/0.3 mL injection Inject 0.3 mL (0.3 mg total) into the muscle once for 1 dose. 2 each 1    EPIPEN 0.3 mg/0.3 mL injection Inject 0.3 mL (0.3 mg total) into the muscle once for 1 dose. 2 each 0    sertraline (ZOLOFT) 50 MG tablet TAKE 1 TABLET(50 MG) BY MOUTH DAILY 30 tablet 0     No current facility-administered medications for this visit.       Past Surgical Hx:  No past surgical history on file.    Social Hx:    Social History     Socioeconomic History    Marital status: Single     Spouse name: None    Number of children: None    Years of education: None    Highest education level: None   Occupational History    Occupation: [employer]   Tobacco Use    Smoking status: Former     Types: Cigarettes    Smokeless tobacco: Never    Tobacco comments:     Currently vapes   Vaping Use    Vaping Use: Former   Substance and Sexual Activity    Alcohol use: Yes     Comment: occasional    Drug use: Yes     Types: Marijuana    Sexual activity: Yes     Partners: Male     Birth control/protection: Condom     Social Determinants of Health     Physical Activity: Insufficiently Active (07/23/2021)    Exercise Vital Sign     Days of Exercise per Week: 3 days     Minutes of Exercise per Session: 30 min       Family Hx:    Family History   Problem Relation Age of Onset    Diabetes Mother     Asthma Mother     Heart disease Mother     Cancer Mother     Spina bifida Mother     Heart disease Father        ALLERGIES:    Allergies   Allergen Reactions    Venom-Honey Bee Anaphylaxis

## 2022-07-07 DIAGNOSIS — G35 Multiple sclerosis: Principal | ICD-10-CM

## 2022-07-07 MED ORDER — KESIMPTA PEN 20 MG/0.4 ML SUBCUTANEOUS PEN INJECTOR
1 refills | 0 days | Status: CP
Start: 2022-07-07 — End: ?
  Filled 2022-08-01: qty 1.2, 28d supply, fill #0

## 2022-07-16 NOTE — Unmapped (Signed)
[facility] Specialty Medication Onboarding    Specialty Medication: Kesimpta  Prior Authorization: Approved   Financial Assistance: No - copay  <$25  Final Copay/Day Supply: $4 / 28    Insurance Restrictions: None     Notes to Pharmacist: Appeal approved    The triage team has completed the benefits investigation and has determined that the patient is able to fill this medication at [facility]. Please contact the patient to complete the onboarding or follow up with the prescribing physician as needed.

## 2022-07-16 NOTE — Unmapped (Signed)
[facility]   Patient Onboarding/Medication Counseling    Richard Mccoy is a 26 y.o. male with multiple sclerosis who I am counseling today on initiation of therapy.  I am speaking to the patient.    Was a Nurse, learning disability used for this call? No    Verified patient's date of birth / HIPAA.    Specialty medication(s) to be sent: Neurology: Kesimpta      Non-specialty medications/supplies to be sent: sharps      Medications not needed at this time: n/a         Kesimpta (ofatumumab)    Medication & Administration     Dosage: Inject the contents of 1 pen (20mg ) under the skin once weekly at week 0, 1 and 2 then inject once monthly beginning at week 4.    Lab tests required prior to treatment initiation:  Serum Immunoglobulins: Serum immunoglobulin testing resulted in adequate levels.  Hepatitis B: Hepatitis B serology studies are complete and non-reactive.    Administration: Inject under the skin of the abdomen, upper thigh or outer upper arm. Rotate injection sites.    [facility] pre-medication recommendation: Take 650mg  of acetaminophen and 25mg  of diphenhydramine 30 minutes prior to [facility] injection.    Injection instructions:  Remove 1 pen from the refrigerator and let stand at room temperature for 15 to 30 minutes.  Examine the pen and ensure the following:  The expiration date has not passed.  The liquid inside the pen is clear to slightly cloudy and does not contain any visible particles.  You may see a small air bubble; this is normal.  Choose your injection site and clean with an alcohol wipe; allow to air dry completely.  Always avoid areas where the skin is tender, bruised, red, scaly or hard or where there are moles, scars or stretch marks.  Remove the cap from the pen by twisting in the direction of the arrow.  Discard the cap. Do not try to reattach the cap.  Once the cap is removed the Kesimpta pen must be used within 5 minutes.  Hold the pen at a 90 degree angle to your clean injection site.  Press the pen firmly against your skin to start the injection. You will hear a loud click.  Continue to hold the pen firmly on your skin throughout the entire injection.  You will hear a second loud click which signals that the injection is almost complete.  You will know that the injection is complete when the green indicator completely fills the window and has stopped moving.  Remove the pen from your skin and discard.    Adherence/Missed dose instructions: Administer a missed dose as soon as you remember and start a new schedule based on that date.    Goals of Therapy     To slow the progression of multiple sclerosis    Side Effects & Monitoring Parameters   Injection site irritation  Headache   Flu-like symptoms  Mild fever  Signs of a common cold    The following side effects should be reported to the provider:  Signs of an allergic reaction  Signs of infection (fever, chills, sore throat, new or worsening cough etc.)  Signs of liver problems (yellowing of the skin/eyes, darkened urine, light-colored stools, severe stomach pain/vomiting)        Contraindications, Warnings, & Precautions     Required pre-testing:  Hep B  IgG  CBC with differential  CMP  Contraindications:   Active  Hepatitis B infection  Warnings and Precautions:  Infections: an increased risk of infection has been observed with similar medications.  Most common infections in Kesimpta-treated patients includes upper respiratory tract and urinary tract infections.  Hepatitis B virus: reactivation is possible though there have been no reports in patients treated with Kesimpta.  Progressive multifocal leukoencephalopathy: There is an increased risk of developing PML. PML has been reported in patients treated with ofatumumab for CLL; there have been no reports in Kesimpta-treated patients.  Kesimpta should be permanently discontinued in the setting of a PML diagnosis.  Reduction in immunoglobulins: decreased immunoglobulin levels have been observed in patients take Kesimpta. Levels of quantitative serum immunoglobulins should be monitored during treatment and after discontinuation until B-cell repletion.  Consider discontinuing Kesimpta if a patient with low immunoglobulins develops a serious or recurrent infection.  Consider discontinuing Kesimpta if a patient has prolonged hypogammaglobulinemia that requires treatment.  Fetal risk: Animal data suggests Kesimpta can cause fetal harm. Females of reproductive potential should use effective contraception during treatment and for at least 6 months after the last dose.    Drug/Food Interactions     Medication list reviewed in Epic. The patient was instructed to inform the care team before taking any new medications or supplements. No drug interactions identified.   All necessary immunizations should be completed at least 4 weeks prior to initiation of Kesimpta.   Kesimpta may interfere with the effectiveness of inactivated vaccines.  Live vaccines are not recommended during treatment with Kesimpta and after discontinuation until B-cell repletion.    Storage, Handling Precautions, & Disposal   Kesimpta should be stored in the refrigerator.  Do not shake Kesimpta pens.  Dispose of used Kesimpta pens in a sharps container.          Current Medications (including OTC/herbals), Comorbidities and Allergies     Current Outpatient Medications   Medication Sig Dispense Refill    baclofen (LIORESAL) 10 MG tablet Take 1 tablet (10 mg total) by mouth Three (3) times a day. 90 tablet 5    EPINEPHrine (EPIPEN) 0.3 mg/0.3 mL injection Inject 0.3 mL (0.3 mg total) into the muscle once for 1 dose. 2 each 1    EPIPEN 0.3 mg/0.3 mL injection Inject 0.3 mL (0.3 mg total) into the muscle once for 1 dose. 2 each 0    ergocalciferol-1,250 mcg, 50,000 unit, (DRISDOL) 1,250 mcg (50,000 unit) capsule Take 1 capsule (1,250 mcg total) by mouth once a week. 4 capsule 11    ofatumumab (KESIMPTA PEN) 20 mg/0.4 mL PnIj Inject contents of pen subcutaneously on week 0, 1, and 2. Skip week 3. Inject on week 4 and every 4 weeks thereafter. Take Benadryl 25 mg and Tylenol 650mg  thirty minutes prior to each injection 1.2 mL 1     No current facility-administered medications for this visit.       Allergies   Allergen Reactions    Venom-Honey Bee Anaphylaxis       Patient Active Problem List   Diagnosis    Bee sting allergy    Muscle spasm    Anxiety    Paresthesia    Multiple sclerosis (CMS-HCC)       Reviewed and up to date in Epic.    Appropriateness of Therapy     Acute infections noted within Epic:  No active infections  Patient reported infection: None    Is medication and dose appropriate based on diagnosis and infection status? Yes    Prescription has been  clinically reviewed: Yes      Baseline Quality of Life Assessment      How many days over the past month did your MS  keep you from your normal activities? For example, brushing your teeth or getting up in the morning. Patient declined to answer    Financial Information     Medication Assistance provided: Prior Authorization    Anticipated copay of $4 reviewed with patient. Verified delivery address.    Delivery Information     Scheduled delivery date: 9/8    Expected start date: 9/8    Medication will be delivered via Same Day Courier to the prescription address in [facility].  This shipment will not require a signature.      Explained the services we provide at [facility] and that each month we would call to set up refills.  Stressed importance of returning phone calls so that we could ensure they receive their medications in time each month.  Informed patient that we should be setting up refills 7-10 days prior to when they will run out of medication.  A pharmacist will reach out to perform a clinical assessment periodically.  Informed patient that a welcome packet, containing information about our pharmacy and other support services, a Notice of Privacy Practices, and a drug information handout will be sent.      The patient or caregiver noted above participated in the development of this care plan and knows that they can request review of or adjustments to the care plan at any time.      Patient or caregiver verbalized understanding of the above information as well as how to contact the pharmacy at [phone] option 4 with any questions/concerns.  The pharmacy is open Monday through Friday 8:30am-4:30pm.  A pharmacist is available 24/7 via pager to answer any clinical questions they may have.    Patient Specific Needs     Does the patient have any physical, cognitive, or cultural barriers? No    Does the patient have adequate living arrangements? (i.e. the ability to store and take their medication appropriately) Yes    Did you identify any home environmental safety or security hazards? No    Patient prefers to have medications discussed with  Patient     Is the patient or caregiver able to read and understand education materials at a high school level or above? Yes    Patient's primary language is  English     Is the patient high risk? No    SOCIAL DETERMINANTS OF HEALTH     At the [facility], we have learned that life circumstances - like trouble affording food, housing, utilities, or transportation can affect the health of many of our patients.   That is why we wanted to ask: are you currently experiencing any life circumstances that are negatively impacting your health and/or quality of life? Patient declined to answer    Social Determinants of Health     Financial Resource Strain: Not on file   Internet Connectivity: Not on file   Food Insecurity: Not on file   Tobacco Use: Medium Risk (07/03/2022)    Patient History     Smoking Tobacco Use: Former     Smokeless Tobacco Use: Never     Passive Exposure: Not on file   Housing/Utilities: Not on file   Alcohol Use: Not At Risk (07/23/2021)    Alcohol Use     How often do you have a  drink containing alcohol?: 2 - 4 times per month     How many drinks containing alcohol do you have on a typical day when you are drinking?: 1 - 2     How often do you have 5 or more drinks on one occasion?: Never   Transportation Needs: Not on file   Substance Use: Not on file   Health Literacy: Not on file   Physical Activity: Insufficiently Active (07/23/2021)    Exercise Vital Sign     Days of Exercise per Week: 3 days     Minutes of Exercise per Session: 30 min   Interpersonal Safety: Not on file   Stress: Not on file   Intimate Partner Violence: Not on file   Depression: Not on file   Social Connections: Not on file       Would you be willing to receive help with any of the needs that you have identified today? Not applicable       Mirian Capuchin, PharmD  [facility] Specialty Pharmacist

## 2022-07-29 DIAGNOSIS — G35 Multiple sclerosis: Principal | ICD-10-CM

## 2022-07-29 NOTE — Unmapped (Signed)
Faxed refill request received from pharmacy: [facility]    Provider: Yolande Jolly, PA    Last Visit Date: 07/03/2022  Next Visit Date: 11/03/2022    Lab Results   Component Value Date    JC Virus AB NEGATIVE 12/06/2021    Hep B Surface Ag Nonreactive 12/06/2021    Hep B S Ab Nonreactive 12/06/2021    Hep B Surf Ab Quant <8.00 12/06/2021    Hep B Core Total Ab Nonreactive 12/06/2021    Hepatitis C Ab Nonreactive 12/06/2021    HIV Antigen/Antibody Combo Nonreactive 12/06/2021        No results found for this or any previous visit.      No results found for this or any previous visit.      No results found for this or any previous visit.    Discontinued at 07/03/22 visit

## 2022-07-30 MED ORDER — EMPTY CONTAINER
3 refills | 0 days
Start: 2022-07-30 — End: ?

## 2022-07-30 NOTE — Unmapped (Signed)
[facility] - Enhanced Care Program  Reason for Call: Medication and Pharmacy; Type: [facility] Refill Call  Referral Request: Neurology - Worthy Flank, CPP    Summary of Telephone Encounter  ?? I called pt to assist with coordination of medication onboarding for Kesimpta. [facility] has called pt multiple times but has not been able to reach pt .  ??  I was able to reach pt at phone # [phone] and provided a conference call to [facility]. I  spoke with Trula Ore who stated she will connect pt with Precious Haws PharmD.   Follow-Up:  ??? Worthy Flank, CPP was notified that I was able to reach pt and connect with [facility] for on boarding.     Call Attempt #: 1  Time Spent on Referral: 10 minutes  Number of Days Spent on Referral: 1     Aldean Ast, [facility]   Pharmacy Department  [facility]  [address]   [place], [place] [address]  [phone]

## 2022-08-01 MED FILL — EMPTY CONTAINER: 120 days supply | Qty: 1 | Fill #0

## 2022-08-26 NOTE — Unmapped (Signed)
[facility] Clinical Assessment & Refill Coordination Note    Richard Mccoy, DOB: August 12, 1996  Phone: [phone] (home) [phone] (work)    All above HIPAA information was verified with patient.     Was a Nurse, learning disability used for this call? No    Specialty Medication(s):   Neurology: Kesimpta     Current Outpatient Medications   Medication Sig Dispense Refill    baclofen (LIORESAL) 10 MG tablet Take 1 tablet (10 mg total) by mouth Three (3) times a day. 90 tablet 5    empty container Misc Use as directed to dispose of Kesimpta Pens 1 each 3    EPIPEN 0.3 mg/0.3 mL injection Inject 0.3 mL (0.3 mg total) into the muscle once for 1 dose. 2 each 0    ergocalciferol-1,250 mcg, 50,000 unit, (DRISDOL) 1,250 mcg (50,000 unit) capsule Take 1 capsule (1,250 mcg total) by mouth once a week. 4 capsule 11    ofatumumab (KESIMPTA PEN) 20 mg/0.4 mL PnIj Inject the contents of one pen under the skin on week 0, 1, and 2. Skip week 3. Inject on week 4 and every 4 weeks thereafter. Take Benadryl 25 mg and Tylenol 650mg  thirty minutes prior to each injection 1.2 mL 1     No current facility-administered medications for this visit.        Changes to medications: Richard Mccoy reports no changes at this time.    Allergies   Allergen Reactions    Venom-Honey Bee Anaphylaxis       Changes to allergies: No    SPECIALTY MEDICATION ADHERENCE     Kesimpta 20  mg/0.57ml : 0 days of medicine on hand       Medication Adherence    Patient reported X missed doses in the last month: 0  Specialty Medication: Kesimpta  Patient is on additional specialty medications: No  Informant: patient                            Specialty medication(s) dose(s) confirmed: Regimen is correct and unchanged.     Are there any concerns with adherence? No    Adherence counseling provided? Not needed    CLINICAL MANAGEMENT AND INTERVENTION      Clinical Benefit Assessment:    Do you feel the medicine is effective or helping your condition? Yes    Clinical Benefit counseling provided? Not needed    Adverse Effects Assessment:    Are you experiencing any side effects? No    Are you experiencing difficulty administering your medicine? No    Quality of Life Assessment:    Quality of Life    Rheumatology  Oncology  Dermatology  Cystic Fibrosis          How many days over the past month did your MS  keep you from your normal activities? For example, brushing your teeth or getting up in the morning. 0    Have you discussed this with your provider? Not needed    Acute Infection Status:    Acute infections noted within Epic:  No active infections  Patient reported infection: None    Therapy Appropriateness:    Is therapy appropriate and patient progressing towards therapeutic goals? Yes, therapy is appropriate and should be continued    DISEASE/MEDICATION-SPECIFIC INFORMATION      For patients on injectable medications: Patient currently has 0 doses left.  Next injection is scheduled for within the next week.  Multiple Sclerosis: Have you experienced any flares in the last month? No    PATIENT SPECIFIC NEEDS     Does the patient have any physical, cognitive, or cultural barriers? No    Is the patient high risk? No    Did the patient require a clinical intervention? No    Does the patient require physician intervention or other additional services (i.e., nutrition, smoking cessation, social work)? No    SOCIAL DETERMINANTS OF HEALTH     At the [facility], we have learned that life circumstances - like trouble affording food, housing, utilities, or transportation can affect the health of many of our patients.   That is why we wanted to ask: are you currently experiencing any life circumstances that are negatively impacting your health and/or quality of life? Patient declined to answer    Social Determinants of Health     Financial Resource Strain: Not on file   Internet Connectivity: Not on file   Food Insecurity: Not on file   Tobacco Use: Medium Risk (07/03/2022)    Patient History     Smoking Tobacco Use: Former     Smokeless Tobacco Use: Never     Passive Exposure: Not on file   Housing/Utilities: Not on file   Alcohol Use: Not At Risk (07/23/2021)    Alcohol Use     How often do you have a drink containing alcohol?: 2 - 4 times per month     How many drinks containing alcohol do you have on a typical day when you are drinking?: 1 - 2     How often do you have 5 or more drinks on one occasion?: Never   Transportation Needs: Not on file   Substance Use: Not on file   Health Literacy: Not on file   Physical Activity: Insufficiently Active (07/23/2021)    Exercise Vital Sign     Days of Exercise per Week: 3 days     Minutes of Exercise per Session: 30 min   Interpersonal Safety: Not on file   Stress: Not on file   Intimate Partner Violence: Not on file   Depression: Not on file   Social Connections: Not on file       Would you be willing to receive help with any of the needs that you have identified today? Not applicable       SHIPPING     Specialty Medication(s) to be Shipped:   Neurology: Kesimpta    Other medication(s) to be shipped: No additional medications requested for fill at this time     Changes to insurance: No    Delivery Scheduled: Yes, Expected medication delivery date: 08/29/22.     Medication will be delivered via Same Day Courier to the confirmed prescription address in [facility].    The patient will receive a drug information handout for each medication shipped and additional FDA Medication Guides as required.  Verified that patient has previously received a Conservation officer, historic buildings and a Surveyor, mining.    The patient or caregiver noted above participated in the development of this care plan and knows that they can request review of or adjustments to the care plan at any time.      All of the patient's questions and concerns have been addressed.    Arnold Long, PharmD   [facility] Specialty Pharmacist

## 2022-08-29 MED FILL — KESIMPTA PEN 20 MG/0.4 ML SUBCUTANEOUS PEN INJECTOR: 28 days supply | Qty: 0.4 | Fill #1

## 2022-11-03 ENCOUNTER — Ambulatory Visit
Admit: 2022-11-03 | Discharge: 2022-11-04 | Payer: PRIVATE HEALTH INSURANCE | Attending: Pharmacist Clinician (PhC)/ Clinical Pharmacy Specialist | Primary: Pharmacist Clinician (PhC)/ Clinical Pharmacy Specialist

## 2022-11-03 DIAGNOSIS — Z5181 Encounter for therapeutic drug level monitoring: Principal | ICD-10-CM

## 2022-11-03 DIAGNOSIS — G35 Multiple sclerosis: Principal | ICD-10-CM

## 2022-11-03 LAB — CBC W/ AUTO DIFF
BASOPHILS ABSOLUTE COUNT: 0 10*9/L (ref 0.0–0.1)
BASOPHILS RELATIVE PERCENT: 0.6 %
EOSINOPHILS ABSOLUTE COUNT: 0.1 10*9/L (ref 0.0–0.5)
EOSINOPHILS RELATIVE PERCENT: 1.4 %
HEMATOCRIT: 38 % — ABNORMAL LOW (ref 39.0–48.0)
HEMOGLOBIN: 13.1 g/dL (ref 12.9–16.5)
LYMPHOCYTES ABSOLUTE COUNT: 1.7 10*9/L (ref 1.1–3.6)
LYMPHOCYTES RELATIVE PERCENT: 34.6 %
MEAN CORPUSCULAR HEMOGLOBIN CONC: 34.4 g/dL (ref 32.0–36.0)
MEAN CORPUSCULAR HEMOGLOBIN: 30.3 pg (ref 25.9–32.4)
MEAN CORPUSCULAR VOLUME: 88.3 fL (ref 77.6–95.7)
MEAN PLATELET VOLUME: 10.4 fL (ref 6.8–10.7)
MONOCYTES ABSOLUTE COUNT: 0.5 10*9/L (ref 0.3–0.8)
MONOCYTES RELATIVE PERCENT: 10.6 %
NEUTROPHILS ABSOLUTE COUNT: 2.6 10*9/L (ref 1.8–7.8)
NEUTROPHILS RELATIVE PERCENT: 52.8 %
PLATELET COUNT: 106 10*9/L — ABNORMAL LOW (ref 150–450)
RED BLOOD CELL COUNT: 4.3 10*12/L (ref 4.26–5.60)
RED CELL DISTRIBUTION WIDTH: 14.1 % (ref 12.2–15.2)
WBC ADJUSTED: 5 10*9/L (ref 3.6–11.2)

## 2022-11-03 LAB — HEPATIC FUNCTION PANEL
ALBUMIN: 4 g/dL (ref 3.4–5.0)
ALKALINE PHOSPHATASE: 45 U/L — ABNORMAL LOW (ref 46–116)
ALT (SGPT): 15 U/L (ref 10–49)
AST (SGOT): 20 U/L (ref <=34)
BILIRUBIN DIRECT: 0.1 mg/dL (ref 0.00–0.30)
BILIRUBIN TOTAL: 0.4 mg/dL (ref 0.3–1.2)
PROTEIN TOTAL: 6.9 g/dL (ref 5.7–8.2)

## 2022-11-03 LAB — IGM: GAMMAGLOBULIN; IGM: 50 mg/dL (ref 40–230)

## 2022-11-03 LAB — IGG: GAMMAGLOBULIN; IGG: 1055 mg/dL (ref 650–1600)

## 2022-11-03 NOTE — Unmapped (Signed)
[facility] Neurology Pharmacotherapy Clinic Summary       [facility] [address]  [facility] [address] [place]  [address]  [place] [address]  [phone]    Date: 11/03/2022  Patient Name: Richard Mccoy  MRN: [id number]  PCP: Sheryn Bison, Clara Josephin*            Richard Mccoy is a 26 y.o. male presenting for follow up at the [facility] 3.5 months after starting Kesimpta for  multiple sclerosis. Last seen by Yolande Jolly, PA in August 2023.       Subjective:   Subjective        Obtained history from the patient who was alone.      Interval history since last visit: Patient reports he is doing well. Denies new MS symptoms. Kesimpta injection has also been going well. He has his baclofen at home when he needs it but states he takes it very infrequently and it doesn't help much. He noted he never got a pen for November so did not administer a dose that month.      Medication Access:  --Patient reports no issues with access to medications/cost    - Medication Reconciliation:   --Reviewed medication list with Melissa Montane and updated as necessary  --Drug-drug and drug-food interactions checked: yes, no interactions present  --Polypharmacy (patient on 5 or more medications/supplements): no   --1 discrepancies identified   -added:   -removed: vitamin d 4000 units   -updated:  --Melissa Montane is using [facility] pharmacy for non-specialty medications    Current Medications:  Current Outpatient Medications   Medication Sig Dispense Refill    ergocalciferol-1,250 mcg, 50,000 unit, (DRISDOL) 1,250 mcg (50,000 unit) capsule       baclofen (LIORESAL) 10 MG tablet Take 1 tablet (10 mg total) by mouth Three (3) times a day. 90 tablet 5    empty container Misc Use as directed to dispose of Kesimpta Pens 1 each 3    EPIPEN 0.3 mg/0.3 mL injection Inject 0.3 mL (0.3 mg total) into the muscle once for 1 dose. 2 each 0    ofatumumab (KESIMPTA PEN) 20 mg/0.4 mL PnIj Inject the contents of one pen under the skin on week 0, 1, and 2. Skip week 3. Inject on week 4 and every 4 weeks thereafter. Take Benadryl 25 mg and Tylenol 650mg  thirty minutes prior to each injection 1.2 mL 1     No current facility-administered medications for this visit.             Assessment & Plan:   Assessment     Richard Mccoy is a 26 y.o. male seen in consultation for the following problems.      Diagnosis ICD-10-CM Associated Orders   1. Multiple sclerosis (CMS-HCC)  G35 - Continue Kesimpta, 1 pen every 4 weeks  - Started Kesimpta late August 2023  -monitoring: Cbc w/ diff, LFTs, IgG, IgM to be checked today  -adherence/compliance: patient reports  1 missed doses in the last month, missed in November 2023. Spoke with [facility] pharmacy and patient did not answer phone for refill attempts. Have asked them to call him 1 more time.   Confirmed patient is keeping the pen in the fridge until ready to do the injection. Emphasized to rotate sites between legs and abdomen.   -drug interactions: assessed for drug interactions  -safety/tolerability: Patient reports no side effects.  Denies injection site irritation and systemic injection reaction. Is not using Tylenol and Benadryl prior to injection  -Confirmed DMT refill needed today; no refills sent today              --Melissa Montane is using [facility] for specialty pharmacy       2. Encounter for medication monitoring  Z51.81 Labs to be checked today:  CBC w/ Differential     Hepatic Function Panel     IgG     IgM    For next appointment, recommend checking vitamin D                 - Return to clinic in approximately three months for follow-up with [facility]. MRIs to be done prior to this appointment.Melissa Montane verbalized understanding of all counseling points. Provided Neurology Pharmacy Team contacts and advised to please call should she have any further questions.     Medications reviewed in EPIC medication station and updated today by the clinical pharmacist practitioner.     I spent a total of 20 minutes face to face with the patient delivering clinical care and providing education/counseling.     Worthy Flank, PharmD, CPP  Clinical Pharmacist, [facility]  Phone: [phone]

## 2022-11-03 NOTE — Unmapped (Addendum)
Continue Kesimpta, 1 injection every 4 week  I will reach out to the pharmacy about sending you your next refill ASAP  Lab work today  Follow up with [place] in March (keep current appointment), MRI will be done directly before this appointment

## 2022-11-03 NOTE — Unmapped (Signed)
[name] Refill Coordination Note    Specialty Medication(s) to be Shipped:   Neurology: Kesimpta    Other medication(s) to be shipped: No additional medications requested for fill at this time     Richard Mccoy, DOB: [birth year]  Phone: [phone] (home) [phone] (work)      All above HIPAA information was verified with patient.     Was a Nurse, learning disability used for this call? No    Completed refill call assessment today to schedule patient's medication shipment from the [facility] [phone]).  All relevant notes have been reviewed.     Specialty medication(s) and dose(s) confirmed: Regimen is correct and unchanged.   Changes to medications: Emery reports no changes at this time.  Changes to insurance: No  New side effects reported not previously addressed with a pharmacist or physician: None reported  Questions for the pharmacist: No    Confirmed patient received a Conservation officer, historic buildings and a Surveyor, mining with first shipment. The patient will receive a drug information handout for each medication shipped and additional FDA Medication Guides as required.       DISEASE/MEDICATION-SPECIFIC INFORMATION        For patients on injectable medications: Patient currently has 0 doses left.  Next injection is scheduled for ASAP .    SPECIALTY MEDICATION ADHERENCE     Medication Adherence    Patient reported X missed doses in the last month: 1  Specialty Medication: Kesimpta  pen  Patient is on additional specialty medications: No  Patient is on more than two specialty medications: No  Any gaps in refill history greater than 2 weeks in the last 3 months: no  Demonstrates understanding of importance of adherence: yes                          Were doses missed due to medication being on hold? No    KESIMPTA PEN 20 mg/0.4 mL -  0 days of medicine on hand        REFERRAL TO PHARMACIST     Referral to the pharmacist: Not needed      [facility]     Shipping address confirmed in Epic. Delivery Scheduled: Yes, Expected medication delivery date: 11/05/22 .     Medication will be delivered via Same Day Courier to the prescription address in Epic WAM.    Ricci Barker   [facility] Specialty Technician

## 2022-11-05 MED FILL — KESIMPTA PEN 20 MG/0.4 ML SUBCUTANEOUS PEN INJECTOR: 28 days supply | Qty: 0.4 | Fill #2

## 2022-11-21 DIAGNOSIS — G35 Multiple sclerosis: Principal | ICD-10-CM

## 2022-11-21 MED ORDER — LORAZEPAM 0.5 MG TABLET
ORAL_TABLET | ORAL | 0 refills | 0 days | Status: CP
Start: 2022-11-21 — End: ?

## 2022-11-28 NOTE — Unmapped (Signed)
[facility] Pharm[facility] Refill Coordination Note    Specialty Medication(s) to be Shipped:   Neurology: Kesimpta    Other medication(s) to be shipped: No additional medications requested for fill at this time     Richard Mccoy, DOB: [birth year]  Phone: [phone] (home) [phone] (work)      All above HIPAA information was verified with patient.     Was a Nurse, learning disability used for this call? No    Completed refill call assessment today to schedule patient's medication shipment from the [facility] [phone]).  All relevant notes have been reviewed.     Specialty medication(s) and dose(s) confirmed: Regimen is correct and unchanged.   Changes to medications: Richard Mccoy reports no changes at this time.  Changes to insurance: No  New side effects reported not previously addressed with a pharmacist or physician: None reported  Questions for the pharmacist: No    Confirmed patient received a Conservation officer, historic buildings and a Surveyor, mining with first shipment. The patient will receive a drug information handout for each medication shipped and additional FDA Medication Guides as required.       DISEASE/MEDICATION-SPECIFIC INFORMATION        For patients on injectable medications: Patient currently has 0 doses left.  Next injection is scheduled for 12/01/22.    SPECIALTY MEDICATION ADHERENCE     Medication Adherence    Patient reported X missed doses in the last month: 0  Specialty Medication: KESIMPTA PEN 20 mg/0.4 mL  Patient is on additional specialty medications: No                                Were doses missed due to medication being on hold? No    Kesimpta 20/0.4 mg/ml: 0 days of medicine on hand        REFERRAL TO PHARMACIST     Referral to the pharmacist: Not needed      [facility]     Shipping address confirmed in Epic.     Delivery Scheduled: Yes, Expected medication delivery date: 12/01/22.     Medication will be delivered via Same Day Courier to the prescription address in Epic WAM.    Richard Mccoy   [facility] Specialty Technician

## 2022-12-01 MED FILL — KESIMPTA PEN 20 MG/0.4 ML SUBCUTANEOUS PEN INJECTOR: 28 days supply | Qty: 0.4 | Fill #3

## 2022-12-25 DIAGNOSIS — G35 Multiple sclerosis: Principal | ICD-10-CM

## 2022-12-25 MED ORDER — KESIMPTA PEN 20 MG/0.4 ML SUBCUTANEOUS PEN INJECTOR
1 refills | 0 days
Start: 2022-12-25 — End: ?

## 2022-12-26 MED ORDER — KESIMPTA PEN 20 MG/0.4 ML SUBCUTANEOUS PEN INJECTOR
0 refills | 0 days | Status: CP
Start: 2022-12-26 — End: ?

## 2022-12-26 NOTE — Unmapped (Signed)
Request received via interface.     Provider: Yolande Jolly, PA    Last Visit Date: 11/03/2022  Next Visit Date: 02/11/2023    Lab Results   Component Value Date    JC Virus AB NEGATIVE 12/06/2021    Hep B Surface Ag Nonreactive 12/06/2021    Hep B S Ab Nonreactive 12/06/2021    Hep B Surf Ab Quant <8.00 12/06/2021    Hep B Core Total Ab Nonreactive 12/06/2021    Hepatitis C Ab Nonreactive 12/06/2021    HIV Antigen/Antibody Combo Nonreactive 12/06/2021        No results found for this or any previous visit.      No results found for this or any previous visit.      No results found for this or any previous visit.

## 2023-01-02 NOTE — Unmapped (Signed)
The [facility] has made a second and final attempt to reach this patient to refill the following medication:KESIMPTA PEN 20 mg/0.4 mL Pnij (ofatumumab).      We have left voicemails on the following phone numbers: [phone], have sent a text message to the following phone numbers: [phone], and have sent a Mychart questionnaire..    Dates contacted: 12/25/22  and  01/01/23   Last scheduled delivery: 12/01/22     The patient may be at risk of non-compliance with this medication. The patient should call the [facility] at [phone]  Option 4, then Option 2 (all other specialty patients) to refill medication.    Ricci Barker   [facility] Specialty Technician

## 2023-01-06 NOTE — Unmapped (Addendum)
Neurology Clinical Pharmacist Telephone Call    Medication: KESIMPTA PEN 20mg /0.35mL    Attempted to call patient to remind them to request refill of their medication from [facility] pharmacy. No answer, LVM and will continue to try to reach them.    Avel Peace, PharmD Candidate  January 06, 2023 9:39 AM    Worthy Flank, PharmD, CPP  Clinical Pharmacist, [facility]  Phone: [phone]

## 2023-01-08 NOTE — Unmapped (Signed)
Neurology Clinical Pharmacist Telephone Call    Medication: KESIMPTA PEN 20mg /0.31mL    Second attempt to call patient to remind them to request refill of their medication from [facility] pharmacy. No answer, LVM and will continue to try to reach them.     Avel Peace, PharmD Candidate  January 08, 2023 10:03 AM    Worthy Flank, PharmD, CPP  Clinical Pharmacist, [facility]  Phone: [phone]

## 2023-01-12 NOTE — Unmapped (Signed)
Neurology Clinical Pharmacist Telephone Call    Medication: KESIMPTA PEN 20mg /0.99mL    Third and final attempt to call patient to remind them to request refill of their medication from [facility] pharmacy. Called patient and mother's mobile phone. No answer, LVM for both patient and mother.    Avel Peace, PharmD Candidate  January 12, 2023 9:17 AM      Karalee Height, PharmD CPP  Clinical Pharmacist

## 2023-02-11 MED ORDER — ERGOCALCIFEROL (VITAMIN D2) 1,250 MCG (50,000 UNIT) CAPSULE
ORAL_CAPSULE | ORAL | 2 refills | 210 days | Status: CP
Start: 2023-02-11 — End: ?

## 2023-02-11 NOTE — Unmapped (Signed)
Received a fax from the pharmacy regarding a refill on Vitamin D. I have queued the order but needs an updated sig if approved.     Last fill 09/28/2022    Next appt 02/11/23

## 2023-02-12 NOTE — Unmapped (Signed)
Neurology update:  Patient is past due for clinic visit.  My chart message and /or letter sent to patient.  No show for visit today

## 2023-04-24 NOTE — Unmapped (Signed)
Specialty Medication(s): Kesimpta    [name] has been dis-enrolled from the [facility] specialty pharmacy services due to multiple unsuccessful outreach attempts by the pharmacy.    Additional information provided to the patient: n/a    Richard Mccoy, PharmD  [facility] Specialty Pharmacist

## 2023-05-21 MED ORDER — SERTRALINE 50 MG TABLET
ORAL_TABLET | Freq: Every day | ORAL | 0 refills | 30 days
Start: 2023-05-21 — End: ?

## 2023-06-08 ENCOUNTER — Ambulatory Visit: Admit: 2023-06-08 | Discharge: 2023-06-09 | Payer: PRIVATE HEALTH INSURANCE

## 2023-06-08 DIAGNOSIS — Z91030 Bee allergy status: Principal | ICD-10-CM

## 2023-06-08 DIAGNOSIS — Z0000 Encounter for general adult medical examination without abnormal findings: Principal | ICD-10-CM

## 2023-06-08 MED ORDER — EPIPEN 0.3 MG/0.3 ML INJECTION, AUTO-INJECTOR
Freq: Once | INTRAMUSCULAR | 1 refills | 2 days | Status: CP
Start: 2023-06-08 — End: 2023-06-08

## 2023-06-24 ENCOUNTER — Other Ambulatory Visit: Payer: Self-pay

## 2023-06-24 ENCOUNTER — Emergency Department
Admission: EM | Admit: 2023-06-24 | Discharge: 2023-06-24 | Disposition: A | Discharge status: Home / Self Care | Payer: Medicaid Other | Attending: Emergency Medicine | Admitting: Emergency Medicine

## 2023-06-24 ENCOUNTER — Emergency Department: Payer: Medicaid Other

## 2023-06-24 DIAGNOSIS — R0602 Shortness of breath: Secondary | ICD-10-CM | POA: Insufficient documentation

## 2023-06-24 DIAGNOSIS — J45909 Unspecified asthma, uncomplicated: Secondary | ICD-10-CM | POA: Insufficient documentation

## 2023-06-24 DIAGNOSIS — R0789 Other chest pain: Secondary | ICD-10-CM | POA: Insufficient documentation

## 2023-06-24 LAB — CBC
HCT: 39 % (ref 39.0–52.0)
Hemoglobin: 13.7 g/dL (ref 13.0–17.0)
MCH: 30.2 pg (ref 26.0–34.0)
MCHC: 35.1 g/dL (ref 30.0–36.0)
MCV: 85.9 fL (ref 80.0–100.0)
Platelets: 131 10*3/uL — ABNORMAL LOW (ref 150–400)
RBC: 4.54 MIL/uL (ref 4.22–5.81)
RDW: 12.9 % (ref 11.5–15.5)
WBC: 6.2 10*3/uL (ref 4.0–10.5)
nRBC: 0 % (ref 0.0–0.2)

## 2023-06-24 LAB — BASIC METABOLIC PANEL
Anion gap: 8 (ref 5–15)
BUN: 10 mg/dL (ref 6–20)
CO2: 22 mmol/L (ref 22–32)
Calcium: 9.1 mg/dL (ref 8.9–10.3)
Chloride: 107 mmol/L (ref 98–111)
Creatinine, Ser: 0.97 mg/dL (ref 0.61–1.24)
GFR, Estimated: >60 mL/min (ref >60)
Glucose, Bld: 105 mg/dL — ABNORMAL HIGH (ref 70–99)
Potassium: 3.2 mmol/L — ABNORMAL LOW (ref 3.5–5.1)
Sodium: 137 mmol/L (ref 135–145)

## 2023-06-24 LAB — TROPONIN I (HIGH SENSITIVITY): Troponin I (High Sensitivity): 4 ng/L (ref <18)

## 2023-06-24 MED ORDER — HYDROXYZINE PAMOATE 100 MG PO CAPS: 100.0000 mg | ORAL_CAPSULE | Freq: Three times a day (TID) | ORAL | 2 refills | Status: AC | PRN

## 2023-06-24 NOTE — Discharge Instructions (Addendum)
Your blood work, chest x-ray and EKG were very reassuring today. I have attached some more information about shortness of breath.  You can take the hydroxyzine every 6 hours as needed for anxiety.  This will also help your nausea.  Please return to the ED if you have any new or worsening symptoms. It was a pleasure to care for you today!

## 2023-06-24 NOTE — ED Notes (Signed)
Pt verbalizes understanding of discharge instructions. Opportunity for questioning and answers were provided. Pt discharged from ED to home with friend.    

## 2023-06-24 NOTE — ED Provider Notes (Signed)
[facility] Provider Note    Event Date/Time   First MD Initiated Contact with Patient 06/24/23 1455     (approximate)   History   Shortness of Breath   HPI  Omar Cooke is a 27 y.o. male with PMH of multiple sclerosis, asthma and allergies who presents for evaluation of SOB.  Patient states he has been feeling some chest tightness in the past week and recently developed some nausea and stomach pain.  He denies vomiting, diarrhea and fever but feels he may also be constipated.  He had a negative COVID test.  He states he has been taking his MS medication consistently and has been taking Zoloft as needed for when he feels anxious.  He recently lost his job and is unsure if the stress of this is causing his symptoms.      Physical Exam   Triage Vital Signs: ED Triage Vitals  Encounter Vitals Group     BP 06/24/23 1421 136/68     Systolic BP Percentile --      Diastolic BP Percentile --      Pulse Rate 06/24/23 1421 90     Resp 06/24/23 1421 16     Temp 06/24/23 1421 98.4 F (36.9 C)     Temp src --      SpO2 06/24/23 1421 99 %     Weight 06/24/23 1420 175 lb (79.4 kg)     Height 06/24/23 1420 5\' 10"  (1.778 m)     Head Circumference --      Peak Flow --      Pain Score 06/24/23 1420 0     Pain Loc --      Pain Education --      Exclude from Growth Chart --     Most recent vital signs: Vitals:   06/24/23 1421  BP: 136/68  Pulse: 90  Resp: 16  Temp: 98.4 F (36.9 C)  SpO2: 99%   General: Awake, no distress.  CV:  Good peripheral perfusion.  RRR. Resp:  Normal effort.  CTAB. Abd:  No distention.     ED Results / Procedures / Treatments   Labs (all labs ordered are listed, but only abnormal results are displayed) Labs Reviewed  BASIC METABOLIC PANEL - Abnormal; Notable for the following components:      Result Value   Potassium 3.2 (*)    Glucose, Bld 105 (*)    All other components within normal limits  CBC - Abnormal;  Notable for the following components:   Platelets 131 (*)    All other components within normal limits  TROPONIN I (HIGH SENSITIVITY)  TROPONIN I (HIGH SENSITIVITY)     EKG  EKG as interpreted by me: NSR with sinus arrhythmia VR 89 bpm PR 126 ms QTc 352 ms   RADIOLOGY  Chest x-ray obtained to evaluate for potential cause of SOB.  I interpreted the images as well as reviewed the radiologist report.   PROCEDURES:  Critical Care performed: No  Procedures   MEDICATIONS ORDERED IN ED: Medications - No data to display   IMPRESSION / MDM / ASSESSMENT AND PLAN / ED COURSE  I reviewed the triage vital signs and the nursing notes.                             27 year old male presents for evaluation of SOB.  VSS and NAD on exam. Differential diagnosis includes,  but is not limited to, anxiety, asthma exacerbation, viral illness, pneumothorax, cardiac arrhythmia.  Patient's presentation is most consistent with acute complicated illness / injury requiring diagnostic workup.  CBC and BMP unremarkable aside from low platelets and mild hypokalemia.  Troponin is not elevated.   EKG shows NSR, with sinus arrhythmia and no ST changes.   Chest x-ray obtained, interpreted the images as well as reviewed the radiologist report which did not show any acute cardiopulmonary abnormality.  Given patient's negative workup, I believe he stable for outpatient management.  I will prescribe hydroxyzine to help with patient's anxiety, which I believe is a component of his chest tightness.  I instructed him that this medication can be taken as needed, the sertraline cannot.  He also has an albuterol inhaler that is okay for him to use as well.  I advised the patient to return to the ED if he has any new or worsening symptoms.  He voiced understanding, all questions were answered and he was stable at discharge.    FINAL CLINICAL IMPRESSION(S) / ED DIAGNOSES   Final diagnoses:  SOB (shortness of  breath)     Rx / DC Orders   ED Discharge Orders          Ordered    hydrOXYzine (VISTARIL) 100 MG capsule  3 times daily PRN        06/24/23 1631             Note:  This document was prepared using Dragon voice recognition software and may include unintentional dictation errors.   Cameron Ali, PA-C 06/24/23 1636    Minna Antis, MD 06/24/23 2002

## 2023-06-24 NOTE — ED Triage Notes (Signed)
Pt to for shob for a week with chest tightness. States has tested negative for COVID +nausea.
